# Patient Record
Sex: Female | Born: 1975 | Race: Black or African American | Hispanic: No | Marital: Single | Smoking: Current every day smoker
Health system: Southern US, Community
[De-identification: ages and names within clinical notes are randomized; demographics above are authoritative.]

## PROBLEM LIST (undated history)

## (undated) ENCOUNTER — Ambulatory Visit (HOSPITAL_COMMUNITY): Payer: Commercial Managed Care - PPO

## (undated) DIAGNOSIS — M1990 Unspecified osteoarthritis, unspecified site: Secondary | ICD-10-CM

## (undated) DIAGNOSIS — G4730 Sleep apnea, unspecified: Secondary | ICD-10-CM

## (undated) DIAGNOSIS — M7542 Impingement syndrome of left shoulder: Secondary | ICD-10-CM

## (undated) DIAGNOSIS — F419 Anxiety disorder, unspecified: Secondary | ICD-10-CM

## (undated) DIAGNOSIS — M797 Fibromyalgia: Secondary | ICD-10-CM

## (undated) DIAGNOSIS — E039 Hypothyroidism, unspecified: Secondary | ICD-10-CM

## (undated) DIAGNOSIS — F3181 Bipolar II disorder: Secondary | ICD-10-CM

## (undated) DIAGNOSIS — D649 Anemia, unspecified: Secondary | ICD-10-CM

## (undated) DIAGNOSIS — K219 Gastro-esophageal reflux disease without esophagitis: Secondary | ICD-10-CM

---

## 1999-06-08 HISTORY — PX: ANTERIOR CRUCIATE LIGAMENT REPAIR: SHX115

## 2005-04-22 ENCOUNTER — Emergency Department (HOSPITAL_COMMUNITY): Admission: EM | Admit: 2005-04-22 | Discharge: 2005-04-22 | Payer: Self-pay | Admitting: Emergency Medicine

## 2007-06-08 HISTORY — PX: TOTAL KNEE ARTHROPLASTY: SHX125

## 2012-03-07 DIAGNOSIS — M7542 Impingement syndrome of left shoulder: Secondary | ICD-10-CM

## 2012-03-07 HISTORY — DX: Impingement syndrome of left shoulder: M75.42

## 2012-03-21 ENCOUNTER — Encounter (HOSPITAL_BASED_OUTPATIENT_CLINIC_OR_DEPARTMENT_OTHER): Payer: Self-pay | Admitting: *Deleted

## 2012-03-21 NOTE — Pre-Procedure Instructions (Signed)
Discussed pt's sx. of sleep apnea with Dr. Ivin Booty; pt. OK to come for surgery

## 2012-03-24 ENCOUNTER — Encounter (HOSPITAL_BASED_OUTPATIENT_CLINIC_OR_DEPARTMENT_OTHER): Admission: RE | Payer: Self-pay | Source: Ambulatory Visit

## 2012-03-24 ENCOUNTER — Ambulatory Visit (HOSPITAL_BASED_OUTPATIENT_CLINIC_OR_DEPARTMENT_OTHER): Admission: RE | Admit: 2012-03-24 | Payer: Medicaid Other | Source: Ambulatory Visit | Admitting: Orthopedic Surgery

## 2012-03-24 HISTORY — DX: Hypothyroidism, unspecified: E03.9

## 2012-03-24 HISTORY — DX: Sleep apnea, unspecified: G47.30

## 2012-03-24 HISTORY — DX: Fibromyalgia: M79.7

## 2012-03-24 HISTORY — DX: Anxiety disorder, unspecified: F41.9

## 2012-03-24 HISTORY — DX: Gastro-esophageal reflux disease without esophagitis: K21.9

## 2012-03-24 HISTORY — DX: Bipolar II disorder: F31.81

## 2012-03-24 HISTORY — DX: Impingement syndrome of left shoulder: M75.42

## 2012-03-24 HISTORY — DX: Unspecified osteoarthritis, unspecified site: M19.90

## 2012-03-24 HISTORY — DX: Anemia, unspecified: D64.9

## 2012-03-24 SURGERY — ARTHROSCOPY, SHOULDER
Anesthesia: General | Site: Shoulder | Laterality: Left

## 2012-10-27 ENCOUNTER — Encounter (HOSPITAL_BASED_OUTPATIENT_CLINIC_OR_DEPARTMENT_OTHER): Payer: Self-pay | Admitting: *Deleted

## 2012-10-27 ENCOUNTER — Emergency Department (HOSPITAL_BASED_OUTPATIENT_CLINIC_OR_DEPARTMENT_OTHER)
Admission: EM | Admit: 2012-10-27 | Discharge: 2012-10-27 | Disposition: A | Discharge status: Home / Self Care | Payer: Medicaid Other | Attending: Emergency Medicine | Admitting: Emergency Medicine

## 2012-10-27 DIAGNOSIS — K219 Gastro-esophageal reflux disease without esophagitis: Secondary | ICD-10-CM | POA: Insufficient documentation

## 2012-10-27 DIAGNOSIS — Z862 Personal history of diseases of the blood and blood-forming organs and certain disorders involving the immune mechanism: Secondary | ICD-10-CM | POA: Insufficient documentation

## 2012-10-27 DIAGNOSIS — Z880 Allergy status to penicillin: Secondary | ICD-10-CM | POA: Insufficient documentation

## 2012-10-27 DIAGNOSIS — Z79899 Other long term (current) drug therapy: Secondary | ICD-10-CM | POA: Insufficient documentation

## 2012-10-27 DIAGNOSIS — J029 Acute pharyngitis, unspecified: Secondary | ICD-10-CM | POA: Insufficient documentation

## 2012-10-27 DIAGNOSIS — M19049 Primary osteoarthritis, unspecified hand: Secondary | ICD-10-CM | POA: Insufficient documentation

## 2012-10-27 DIAGNOSIS — Z8659 Personal history of other mental and behavioral disorders: Secondary | ICD-10-CM | POA: Insufficient documentation

## 2012-10-27 DIAGNOSIS — G4730 Sleep apnea, unspecified: Secondary | ICD-10-CM | POA: Insufficient documentation

## 2012-10-27 DIAGNOSIS — F17200 Nicotine dependence, unspecified, uncomplicated: Secondary | ICD-10-CM | POA: Insufficient documentation

## 2012-10-27 DIAGNOSIS — R079 Chest pain, unspecified: Secondary | ICD-10-CM | POA: Insufficient documentation

## 2012-10-27 DIAGNOSIS — Z8739 Personal history of other diseases of the musculoskeletal system and connective tissue: Secondary | ICD-10-CM | POA: Insufficient documentation

## 2012-10-27 DIAGNOSIS — M791 Myalgia, unspecified site: Secondary | ICD-10-CM

## 2012-10-27 DIAGNOSIS — E039 Hypothyroidism, unspecified: Secondary | ICD-10-CM | POA: Insufficient documentation

## 2012-10-27 DIAGNOSIS — R52 Pain, unspecified: Secondary | ICD-10-CM | POA: Insufficient documentation

## 2012-10-27 DIAGNOSIS — M1710 Unilateral primary osteoarthritis, unspecified knee: Secondary | ICD-10-CM | POA: Insufficient documentation

## 2012-10-27 NOTE — ED Provider Notes (Signed)
Medical screening examination/treatment/procedure(s) were performed by non-physician practitioner and as supervising physician I was immediately available for consultation/collaboration.   Gavin Pound. Kein Carlberg, MD 10/27/12 1438

## 2012-10-27 NOTE — ED Notes (Signed)
Patient states she developed right chest pain and tightness approximately 3 days ago.  States the pain has moved from her right chest to bilateral arms and legs.  Today pain has progressed into her face.

## 2012-10-27 NOTE — ED Provider Notes (Addendum)
History     CSN: [id number]  Arrival date & time 10/27/12  1231   First MD Initiated Contact with Patient 10/27/12 1343      Chief Complaint  Patient presents with  . Pain    generalized body pain    (Consider location/radiation/quality/duration/timing/severity/associated sxs/prior treatment) Patient is a 37 y.o. female presenting with musculoskeletal pain.  Muscle Pain This is a recurrent problem. The current episode started in the past 7 days. The problem occurs 2 to 4 times per day. The problem has been gradually worsening. Associated symptoms include chest pain, myalgias and a sore throat. She has tried nothing for the symptoms. The treatment provided no relief.    Past Medical History  Diagnosis Date  . Anemia     no current meds.  . Arthritis     knee, hands  . Fibromyalgia   . GERD (gastroesophageal reflux disease)   . Hypothyroidism   . Anxiety   . Bipolar 2 disorder     no current meds.  . Sleep apnea     states daytime fatigue, occ. wakes up gasping/choking; has not had a sleep study  . Impingement syndrome, shoulder, left 03/2012    Past Surgical History  Procedure Laterality Date  . Total knee arthroplasty  2009    left  . Cesarean section  1994  . Anterior cruciate ligament repair  2001    left    No family history on file.  History  Substance Use Topics  . Smoking status: Current Every Day Smoker -- 1.00 packs/day for 15 years    Types: Cigarettes  . Smokeless tobacco: Never Used  . Alcohol Use: Yes     Comment: socially    OB History   Grav Para Term Preterm Abortions TAB SAB Ect Mult Living                  Review of Systems  HENT: Positive for sore throat.   Cardiovascular: Positive for chest pain.  Musculoskeletal: Positive for myalgias.  All other systems reviewed and are negative.    Allergies  Penicillins  Home Medications   Current Outpatient Rx  Name  Route  Sig  Dispense  Refill  . HYDROmorphone HCl (EXALGO) 12 MG  TB24   Oral   Take 16 mg by mouth daily.         Marland Kitchen levothyroxine (SYNTHROID, LEVOTHROID) 175 MCG tablet   Oral   Take 175 mcg by mouth daily.         Marland Kitchen omeprazole (PRILOSEC) 20 MG capsule   Oral   Take 20 mg by mouth daily.         Marland Kitchen oxyCODONE-acetaminophen (PERCOCET) 10-325 MG per tablet   Oral   Take 1 tablet by mouth every 4 (four) hours as needed.           BP 134/85  Temp(Src) 98.2 F (36.8 C) (Oral)  Resp 20  SpO2 100%  LMP 10/09/2012  Physical Exam  Nursing note and vitals reviewed. Constitutional: She is oriented to person, place, and time. She appears well-developed and well-nourished.  HENT:  Head: Normocephalic.  Pharyngeal erythema  Eyes: Pupils are equal, round, and reactive to light.  Neck: Normal range of motion.  Cardiovascular: Normal rate, regular rhythm and normal heart sounds.   Pulmonary/Chest: Effort normal and breath sounds normal. No respiratory distress.  Abdominal: Soft. Bowel sounds are normal.  Musculoskeletal: She exhibits tenderness. She exhibits no edema.  Chest wall, upper extremities  Lymphadenopathy:    She has no cervical adenopathy.  Neurological: She is alert and oriented to person, place, and time.  Skin: Skin is warm and dry.  Psychiatric: She has a normal mood and affect. Her behavior is normal. Judgment and thought content normal.    ED Course  Procedures (including critical care time)  Date: 10/27/2012  Rate: 84  Rhythm: normal sinus rhythm  QRS Axis: normal  Intervals: normal  ST/T Wave abnormalities: normal  Conduction Disutrbances:none  Narrative Interpretation: NSR  Old EKG Reviewed: none available   Labs Reviewed  RAPID STREP SCREEN   No results found.   No diagnosis found.  No ischemia on ECG.  Stroke screen negative.  Pain similar to typical fibromyalgia. Discussed case with Dr. Oletta Lamas.  Doubt ACS, stroke, PE.  Labs reviewed, results shared with patient.  Return precautions discussed.  Patient has  an appointment with her PCP on June 5.  MDM          Jimmye Norman, NP 10/27/12 1433  Jimmye Norman, NP 11/07/12 1429

## 2012-10-29 LAB — CULTURE, GROUP A STREP

## 2012-11-07 NOTE — ED Provider Notes (Signed)
Medical screening examination/treatment/procedure(s) were performed by non-physician practitioner and as supervising physician I was immediately available for consultation/collaboration.   Gavin Pound. Anyah Swallow, MD 11/07/12 1506

## 2014-02-07 ENCOUNTER — Encounter (HOSPITAL_BASED_OUTPATIENT_CLINIC_OR_DEPARTMENT_OTHER): Payer: Self-pay | Admitting: Emergency Medicine

## 2014-02-07 ENCOUNTER — Emergency Department (HOSPITAL_BASED_OUTPATIENT_CLINIC_OR_DEPARTMENT_OTHER)
Admission: EM | Admit: 2014-02-07 | Discharge: 2014-02-07 | Disposition: A | Discharge status: Home / Self Care | Payer: Medicaid Other | Attending: Emergency Medicine | Admitting: Emergency Medicine

## 2014-02-07 DIAGNOSIS — F17200 Nicotine dependence, unspecified, uncomplicated: Secondary | ICD-10-CM | POA: Diagnosis not present

## 2014-02-07 DIAGNOSIS — Y929 Unspecified place or not applicable: Secondary | ICD-10-CM | POA: Insufficient documentation

## 2014-02-07 DIAGNOSIS — K219 Gastro-esophageal reflux disease without esophagitis: Secondary | ICD-10-CM | POA: Insufficient documentation

## 2014-02-07 DIAGNOSIS — Z79899 Other long term (current) drug therapy: Secondary | ICD-10-CM | POA: Diagnosis not present

## 2014-02-07 DIAGNOSIS — W268XXA Contact with other sharp object(s), not elsewhere classified, initial encounter: Secondary | ICD-10-CM | POA: Diagnosis not present

## 2014-02-07 DIAGNOSIS — IMO0002 Reserved for concepts with insufficient information to code with codable children: Secondary | ICD-10-CM

## 2014-02-07 DIAGNOSIS — E039 Hypothyroidism, unspecified: Secondary | ICD-10-CM | POA: Diagnosis not present

## 2014-02-07 DIAGNOSIS — Y9389 Activity, other specified: Secondary | ICD-10-CM | POA: Insufficient documentation

## 2014-02-07 DIAGNOSIS — Z8739 Personal history of other diseases of the musculoskeletal system and connective tissue: Secondary | ICD-10-CM | POA: Diagnosis not present

## 2014-02-07 DIAGNOSIS — Z880 Allergy status to penicillin: Secondary | ICD-10-CM | POA: Insufficient documentation

## 2014-02-07 DIAGNOSIS — S61209A Unspecified open wound of unspecified finger without damage to nail, initial encounter: Secondary | ICD-10-CM | POA: Insufficient documentation

## 2014-02-07 NOTE — Discharge Instructions (Signed)

## 2014-02-07 NOTE — ED Provider Notes (Signed)
CSN: [id number]     Arrival date & time 02/07/14  0439 History   First MD Initiated Contact with Patient 02/07/14 0505     Chief Complaint  Patient presents with  . Finger Injury     (Consider location/radiation/quality/duration/timing/severity/associated sxs/prior Treatment) HPI Comments: Accidentally cut left index finger with a knife making a sandwich earlier today. Mild pain at the site of the cut. Bleeding has stopped with pressure. Tetanus is up-to-date.   Past Medical History  Diagnosis Date  . Anemia     no current meds.  . Arthritis     knee, hands  . Fibromyalgia   . GERD (gastroesophageal reflux disease)   . Hypothyroidism   . Anxiety   . Bipolar 2 disorder     no current meds.  . Sleep apnea     states daytime fatigue, occ. wakes up gasping/choking; has not had a sleep study  . Impingement syndrome, shoulder, left 03/2012   Past Surgical History  Procedure Laterality Date  . Total knee arthroplasty  2009    left  . Cesarean section  1994  . Anterior cruciate ligament repair  2001    left   History reviewed. No pertinent family history. History  Substance Use Topics  . Smoking status: Current Every Day Smoker -- 1.00 packs/day for 15 years    Types: Cigarettes  . Smokeless tobacco: Never Used  . Alcohol Use: Yes     Comment: socially   OB History   Grav Para Term Preterm Abortions TAB SAB Ect Mult Living                 Review of Systems  Skin: Positive for wound.      Allergies  Penicillins  Home Medications   Prior to Admission medications   Medication Sig Start Date End Date Taking? Authorizing Provider  HYDROmorphone HCl (EXALGO) 12 MG TB24 Take 16 mg by mouth daily.    Historical Provider, MD  levothyroxine (SYNTHROID, LEVOTHROID) 175 MCG tablet Take 175 mcg by mouth daily.    Historical Provider, MD  omeprazole (PRILOSEC) 20 MG capsule Take 20 mg by mouth daily.    Historical Provider, MD  oxyCODONE-acetaminophen (PERCOCET) 10-325 MG  per tablet Take 1 tablet by mouth every 4 (four) hours as needed.    Historical Provider, MD   BP 130/87  Pulse 88  Temp(Src) 98.1 F (36.7 C) (Oral)  Resp 20  Ht  (1.651 m)  Wt 215 lb (97.523 kg)  BMI 35.78 kg/m2  SpO2 100%  LMP 01/24/2014 Physical Exam  Musculoskeletal:       Left hand: She exhibits laceration. She exhibits normal range of motion, normal two-point discrimination, normal capillary refill and no deformity. Normal sensation noted. Normal strength noted.       Hands: Neurological: She has normal strength. No cranial nerve deficit or sensory deficit.  Skin:       ED Course  Procedures (including critical care time)  LACERATION REPAIR Performed by: Gilda Crease. Authorized by: Gilda Crease Consent: Verbal consent obtained. Risks and benefits: risks, benefits and alternatives were discussed Consent given by: patient Patient identity confirmed: provided demographic data Prepped and Draped in normal sterile fashion Wound explored  Laceration Location: finger  Laceration Length: 1cm No Foreign Bodies seen or palpated Anesthesia: none Irrigation method: syringe Amount of cleaning: standard Skin closure: Dermabond Patient tolerance: Patient tolerated the procedure well with no immediate complications.     Labs Review Labs Reviewed -  No data to display  Imaging Review No results found.   EKG Interpretation None      MDM   Final diagnoses:  Laceration    Assessment laceration left index finger. Distal sensation and motor function intact. Wound is superficial, closed with Dermabond.    Gilda Crease, MD 02/07/14 [phone]

## 2014-02-07 NOTE — ED Notes (Signed)
Pt lacerated left pointer finger with knife while making sandwich at home tonight

## 2019-07-09 ENCOUNTER — Encounter (HOSPITAL_BASED_OUTPATIENT_CLINIC_OR_DEPARTMENT_OTHER): Payer: Self-pay

## 2019-07-09 ENCOUNTER — Emergency Department (HOSPITAL_BASED_OUTPATIENT_CLINIC_OR_DEPARTMENT_OTHER)
Admission: EM | Admit: 2019-07-09 | Discharge: 2019-07-09 | Disposition: A | Discharge status: Home / Self Care | Payer: Medicaid Other | Attending: Emergency Medicine | Admitting: Emergency Medicine

## 2019-07-09 ENCOUNTER — Emergency Department (HOSPITAL_BASED_OUTPATIENT_CLINIC_OR_DEPARTMENT_OTHER): Payer: Medicaid Other

## 2019-07-09 ENCOUNTER — Other Ambulatory Visit: Payer: Self-pay

## 2019-07-09 DIAGNOSIS — M797 Fibromyalgia: Secondary | ICD-10-CM | POA: Insufficient documentation

## 2019-07-09 DIAGNOSIS — W231XXD Caught, crushed, jammed, or pinched between stationary objects, subsequent encounter: Secondary | ICD-10-CM | POA: Insufficient documentation

## 2019-07-09 DIAGNOSIS — Z87891 Personal history of nicotine dependence: Secondary | ICD-10-CM | POA: Insufficient documentation

## 2019-07-09 DIAGNOSIS — Z79899 Other long term (current) drug therapy: Secondary | ICD-10-CM | POA: Insufficient documentation

## 2019-07-09 DIAGNOSIS — E039 Hypothyroidism, unspecified: Secondary | ICD-10-CM | POA: Insufficient documentation

## 2019-07-09 DIAGNOSIS — S9002XD Contusion of left ankle, subsequent encounter: Secondary | ICD-10-CM | POA: Insufficient documentation

## 2019-07-09 DIAGNOSIS — S9002XA Contusion of left ankle, initial encounter: Secondary | ICD-10-CM

## 2019-07-09 DIAGNOSIS — Z880 Allergy status to penicillin: Secondary | ICD-10-CM | POA: Insufficient documentation

## 2019-07-09 NOTE — ED Notes (Signed)
Pt had ankle x-ray done at [facility] [place] on 1/21 (date of injury);   Imaging waiting on EDMD or EDPA to evaluate the need for additional imaging; EDRN made aware  Please contact imaging to perform xray if warranted

## 2019-07-09 NOTE — Discharge Instructions (Signed)
Your xray is normal. Diclofenac sodium gel (brand is voltaren). Apply to your area of pain as directed. Continue elevating and icing you ankle.  Schedule an appointment with the sports medicine specialist, Dr. Karris Deangelo Likes, for further evaluation.

## 2019-07-09 NOTE — ED Provider Notes (Signed)
[facility] EMERGENCY DEPARTMENT Provider Note   CSN: [id number] Arrival date & time: 07/09/19  1307     History Chief Complaint  Patient presents with  . Ankle Injury    Crystal Leon is a 44 y.o. femalew PMHx fibromyalgia, presenting to the emergency department with complaint of persistent left ankle pain.  Patient closed her left ankle in a car door on 06/27/2019.  She was evaluated at [facility] regional ED on 06/28/2019 - x-ray and diagnosed with contusion.  She states she has been treating symptomatically including ibuprofen, ice, Epsom salt soaks though pain is persisting.  She states the bruising and swelling has significantly improved.  However, she is significant pain when any shoe touches the inside of her ankle.  Pain is not necessarily caused by weightbearing.  She has not followed up outpatient, she does not have a PCP nor health insurance.  The history is provided by the patient and medical records.       Past Medical History:  Diagnosis Date  . Anemia    no current meds.  . Anxiety   . Arthritis    knee, hands  . Bipolar 2 disorder (HCC)    no current meds.  . Fibromyalgia   . GERD (gastroesophageal reflux disease)   . Hypothyroidism   . Impingement syndrome, shoulder, left 03/2012  . Sleep apnea    states daytime fatigue, occ. wakes up gasping/choking; has not had a sleep study    There are no problems to display for this patient.   Past Surgical History:  Procedure Laterality Date  . ANTERIOR CRUCIATE LIGAMENT REPAIR  2001   left  . CESAREAN SECTION  1994  . TOTAL KNEE ARTHROPLASTY  2009   left     OB History   No obstetric history on file.     No family history on file.  Social History   Tobacco Use  . Smoking status: Former Smoker    Packs/day: 1.00    Years: 15.00    Pack years: 15.00    Types: Cigarettes  . Smokeless tobacco: Never Used  Substance Use Topics  . Alcohol use: Yes    Comment: occ  . Drug use: No     Home Medications Prior to Admission medications   Medication Sig Start Date End Date Taking? Authorizing Provider  HYDROmorphone HCl (EXALGO) 12 MG TB24 Take 16 mg by mouth daily.    [provider]  levothyroxine (SYNTHROID, LEVOTHROID) 175 MCG tablet Take 175 mcg by mouth daily.    [provider]  omeprazole (PRILOSEC) 20 MG capsule Take 20 mg by mouth daily.    [provider]  oxyCODONE-acetaminophen (PERCOCET) 10-325 MG per tablet Take 1 tablet by mouth every 4 (four) hours as needed.    [provider]    Allergies    Penicillins  Review of Systems   Review of Systems  All other systems reviewed and are negative.   Physical Exam Updated Vital Signs BP 126/89 (BP Location: Right Arm)   Pulse 86   Temp 98.7 F (37.1 C) (Oral)   Resp 18   Ht 5\' 5"  (1.651 m)   Wt 95.3 kg   LMP 07/05/2019   SpO2 100%   BMI 34.95 kg/m   Physical Exam Vitals and nursing note reviewed.  Constitutional:      General: She is not in acute distress.    Appearance: She is well-developed.  HENT:     Head: Normocephalic and atraumatic.  Eyes:     Conjunctiva/sclera: Conjunctivae normal.  Cardiovascular:     Rate and Rhythm: Normal rate.  Pulmonary:     Effort: Pulmonary effort is normal.  Abdominal:     Palpations: Abdomen is soft.  Musculoskeletal:     Comments: Left ankle without swelling, bruising, or deformity. TTP to medial aspect of the ankle.  Patient is able to range the ankle.  No wounds.  No skin changes.  Normal pulses and sensation.  Skin:    General: Skin is warm.  Neurological:     Mental Status: She is alert.  Psychiatric:        Behavior: Behavior normal.     ED Results / Procedures / Treatments   Labs (all labs ordered are listed, but only abnormal results are displayed) Labs Reviewed - No data to display  EKG None  Radiology DG Ankle Complete Left  Result Date: 07/09/2019 CLINICAL DATA:  Ankle injury with pain.  EXAM: LEFT ANKLE COMPLETE - 3+ VIEW COMPARISON:  None. FINDINGS: No fracture. No subluxation or dislocation. Possible mild soft tissue swelling laterally. IMPRESSION: Negative. Electronically Signed   By: Misty Stanley M.D.   On: 07/09/2019 14:50    Procedures Procedures (including critical care time)  Medications Ordered in ED Medications - No data to display  ED Course  I have reviewed the triage vital signs and the nursing notes.  Pertinent labs & imaging results that were available during my care of the patient were reviewed by me and considered in my medical decision making (see chart for details).    MDM Rules/Calculators/A&P                     Patient presenting with persistent pain to the medial ankle after closing her ankle in a car door on January 20.  She had negative films done outside ED, repeat imaging today to assess for missed occult fracture is also negative.  Swelling and bruising significantly improved, comparing with patient's picture that she provided on her telephone.  Provide referral to sports medicine clinic due to ongoing symptoms.  Recommend she try topical Voltaren gel for pain relief as well.  She agrees with plan at this time.  Safer discharge.  Discussed results, findings, treatment and follow up. Patient advised of return precautions. Patient verbalized understanding and agreed with plan.  Final Clinical Impression(s) / ED Diagnoses Final diagnoses:  Contusion of left ankle, initial encounter    Rx / DC Orders ED Discharge Orders    None       Becci Batty, [place] N, PA-C 07/09/19 1549    Sherwood Gambler, MD 07/11/19 [phone]

## 2019-07-09 NOTE — ED Triage Notes (Addendum)
Pt states she closed left ankle in a car door 1/20-seen at [facility] ED 1/21-NAD-to triage in w/c

## 2019-07-17 ENCOUNTER — Encounter: Payer: Self-pay | Admitting: Family Medicine

## 2019-07-17 ENCOUNTER — Ambulatory Visit: Payer: Self-pay

## 2019-07-17 ENCOUNTER — Ambulatory Visit (INDEPENDENT_AMBULATORY_CARE_PROVIDER_SITE_OTHER): Payer: Self-pay | Admitting: Family Medicine

## 2019-07-17 ENCOUNTER — Other Ambulatory Visit: Payer: Self-pay

## 2019-07-17 VITALS — BP 142/97 | HR 72 | Ht 65.0 in | Wt 210.0 lb

## 2019-07-17 DIAGNOSIS — M898X6 Other specified disorders of bone, lower leg: Secondary | ICD-10-CM

## 2019-07-17 DIAGNOSIS — M76822 Posterior tibial tendinitis, left leg: Secondary | ICD-10-CM

## 2019-07-17 DIAGNOSIS — S9002XA Contusion of left ankle, initial encounter: Secondary | ICD-10-CM | POA: Insufficient documentation

## 2019-07-17 DIAGNOSIS — Q742 Other congenital malformations of lower limb(s), including pelvic girdle: Secondary | ICD-10-CM | POA: Insufficient documentation

## 2019-07-17 NOTE — Assessment & Plan Note (Signed)
Overlying hematoma of the posterior tibialis and medial malleolus. -Counseled on compression and heat. -Counseled on supportive care

## 2019-07-17 NOTE — Assessment & Plan Note (Signed)
Unclear if there is a partial tear near the insertion of the posterior tibialis.  She does have some pain in this area.  Has worsening pain when she is plantarflexed.  There is no hyperemia to suggest an acute change on ultrasound. -Cam walker. -Counseled on home exercise therapy and supportive care. -May need to consider advanced imaging if pain is ongoing upon follow-up.

## 2019-07-17 NOTE — Patient Instructions (Signed)
Nice to meet you Please try to find a CAM walker  Please try heat on the hematoma  Please try the exercises   Please try voltaren over the counter Please send me a message in MyChart with any questions or updates.  Please see me back in 3-4 weeks.   --Dr. Jordan Likes

## 2019-07-17 NOTE — Progress Notes (Signed)
Crystal Leon - 44 y.o. female MRN [id number]  Date of birth: July 11, 1975  SUBJECTIVE:  Including CC & ROS.  Chief Complaint  Patient presents with  . Ankle Pain    left ankle    Crystal Leon is a 44 y.o. female that is presenting with left lower leg and foot pain.  She had initial injury on 1/20 while she was shutting her foot in the door.  She has had pain in the medial aspect of the lower leg since that time.  She also has some pain on the medial aspect of the midfoot.  Has pain with standing.  Has had an ecchymosis change in this area as well..  Independent review of the left ankle x-ray from 2/1 shows no acute bony abnormality.   Review of Systems See HPI   HISTORY: Past Medical, Surgical, Social, and Family History Reviewed & Updated per EMR.   Pertinent Historical Findings include:  Past Medical History:  Diagnosis Date  . Anemia    no current meds.  . Anxiety   . Arthritis    knee, hands  . Bipolar 2 disorder ([place])    no current meds.  . Fibromyalgia   . GERD (gastroesophageal reflux disease)   . Hypothyroidism   . Impingement syndrome, shoulder, left 03/2012  . Sleep apnea    states daytime fatigue, occ. wakes up gasping/choking; has not had a sleep study    Past Surgical History:  Procedure Laterality Date  . ANTERIOR CRUCIATE LIGAMENT REPAIR  2001   left  . CESAREAN SECTION  1994  . TOTAL KNEE ARTHROPLASTY  2009   left    Allergies  Allergen Reactions  . Penicillins Other (See Comments)    YEAST INFECTION    No family history on file.   Social History   Socioeconomic History  . Marital status: Single    Spouse name: Not on file  . Number of children: Not on file  . Years of education: Not on file  . Highest education level: Not on file  Occupational History  . Not on file  Tobacco Use  . Smoking status: Former Smoker    Packs/day: 1.00    Years: 15.00    Pack years: 15.00    Types: Cigarettes  . Smokeless tobacco: Never Used    Substance and Sexual Activity  . Alcohol use: Yes    Comment: occ  . Drug use: No  . Sexual activity: Not on file  Other Topics Concern  . Not on file  Social History Narrative  . Not on file   Social Determinants of Health   Financial Resource Strain:   . Difficulty of Paying Living Expenses: Not on file  Food Insecurity:   . Worried About Charity fundraiser in the Last Year: Not on file  . Ran Out of Food in the Last Year: Not on file  Transportation Needs:   . Lack of Transportation (Medical): Not on file  . Lack of Transportation (Non-Medical): Not on file  Physical Activity:   . Days of Exercise per Week: Not on file  . Minutes of Exercise per Session: Not on file  Stress:   . Feeling of Stress : Not on file  Social Connections:   . Frequency of Communication with Friends and Family: Not on file  . Frequency of Social Gatherings with Friends and Family: Not on file  . Attends Religious Services: Not on file  . Active Member of Clubs or Organizations: Not on  file  . Attends Banker Meetings: Not on file  . Marital Status: Not on file  Intimate Partner Violence:   . Fear of Current or Ex-Partner: Not on file  . Emotionally Abused: Not on file  . Physically Abused: Not on file  . Sexually Abused: Not on file     PHYSICAL EXAM:  VS: BP (!) 142/97   Pulse 72   Ht 5\' 5"  (1.651 m)   Wt 210 lb (95.3 kg)   LMP 07/05/2019   BMI 34.95 kg/m  Physical Exam Gen: NAD, alert, cooperative with exam, well-appearing ENT: normal lips, normal nasal mucosa,  Eye: normal EOM, normal conjunctiva and lids Skin: no rashes, no areas of induration  Neuro: normal tone, normal sensation to touch Psych:  normal insight, alert and oriented MSK:  Left lower leg/ankle/foot: Hyperechoic area where the trauma occurred in the medial aspect of the lower leg. Tenderness to palpation over the medial malleolus. Some tenderness to palpation over the navicular bone. Normal  strength resistance. Normal range of motion. Neurovascularly intact  Limited ultrasound: Left ankle/foot:  There is no overlying hematoma superficial to the posterior tibialis. There is soft tissues edema around this hematoma as well as distally. There is a hypoechoic region that seems to be the deep portion of the posterior tibialis on long views.  Unclear if this is a partial tear or a chronic change.  There is no hyperemia in this area. Achilles is intact and normal. No changes observed on the bone  Summary: Hematoma is present.  Unclear if partial tear of posterior tibialis.  Ultrasound and interpretation by 07/07/2019, MD   ASSESSMENT & PLAN:   Hematoma of left ankle Overlying hematoma of the posterior tibialis and medial malleolus. -Counseled on compression and heat. -Counseled on supportive care  Posterior tibial tendinitis, left Unclear if there is a partial tear near the insertion of the posterior tibialis.  She does have some pain in this area.  Has worsening pain when she is plantarflexed.  There is no hyperemia to suggest an acute change on ultrasound. -Cam walker. -Counseled on home exercise therapy and supportive care. -May need to consider advanced imaging if pain is ongoing upon follow-up.

## 2019-08-07 ENCOUNTER — Encounter: Payer: Self-pay | Admitting: Family Medicine

## 2019-08-07 ENCOUNTER — Other Ambulatory Visit: Payer: Self-pay

## 2019-08-07 ENCOUNTER — Ambulatory Visit (INDEPENDENT_AMBULATORY_CARE_PROVIDER_SITE_OTHER): Payer: Self-pay | Admitting: Family Medicine

## 2019-08-07 VITALS — BP 126/87 | HR 98 | Ht 65.0 in | Wt 210.0 lb

## 2019-08-07 DIAGNOSIS — S86112D Strain of other muscle(s) and tendon(s) of posterior muscle group at lower leg level, left leg, subsequent encounter: Secondary | ICD-10-CM

## 2019-08-07 DIAGNOSIS — S9002XA Contusion of left ankle, initial encounter: Secondary | ICD-10-CM

## 2019-08-07 NOTE — Assessment & Plan Note (Signed)
Pain is ongoing over the posterior tibialis and concern for rupture or partial rupture. -Continue cam walker. -Counseled on supportive care. -MRI to evaluate for possible rupture of the posterior tibialis

## 2019-08-07 NOTE — Progress Notes (Signed)
Crystal Leon - 44 y.o. female MRN [id number]  Date of birth: 12-13-1975  SUBJECTIVE:  Including CC & ROS.  Chief Complaint  Patient presents with  . Follow-up    follow up for left ankle    Crystal Leon is a 44 y.o. female that is following up for her left foot and ankle pain.  She has been in a cam walker since she was seen previously.  She does have some improvement of the ankle pain but the pain over the posterior tibialis is still apparent.  The pain can be severe at times.  Unable to walk without pain.  Unable to walk without the boot.   Review of Systems See HPI   HISTORY: Past Medical, Surgical, Social, and Family History Reviewed & Updated per EMR.   Pertinent Historical Findings include:  Past Medical History:  Diagnosis Date  . Anemia    no current meds.  . Anxiety   . Arthritis    knee, hands  . Bipolar 2 disorder (HCC)    no current meds.  . Fibromyalgia   . GERD (gastroesophageal reflux disease)   . Hypothyroidism   . Impingement syndrome, shoulder, left 03/2012  . Sleep apnea    states daytime fatigue, occ. wakes up gasping/choking; has not had a sleep study    Past Surgical History:  Procedure Laterality Date  . ANTERIOR CRUCIATE LIGAMENT REPAIR  2001   left  . CESAREAN SECTION  1994  . TOTAL KNEE ARTHROPLASTY  2009   left    No family history on file.  Social History   Socioeconomic History  . Marital status: Single    Spouse name: Not on file  . Number of children: Not on file  . Years of education: Not on file  . Highest education level: Not on file  Occupational History  . Not on file  Tobacco Use  . Smoking status: Former Smoker    Packs/day: 1.00    Years: 15.00    Pack years: 15.00    Types: Cigarettes  . Smokeless tobacco: Never Used  Substance and Sexual Activity  . Alcohol use: Yes    Comment: occ  . Drug use: No  . Sexual activity: Not on file  Other Topics Concern  . Not on file  Social History Narrative  . Not  on file   Social Determinants of Health   Financial Resource Strain:   . Difficulty of Paying Living Expenses: Not on file  Food Insecurity:   . Worried About Programme researcher, broadcasting/film/video in the Last Year: Not on file  . Ran Out of Food in the Last Year: Not on file  Transportation Needs:   . Lack of Transportation (Medical): Not on file  . Lack of Transportation (Non-Medical): Not on file  Physical Activity:   . Days of Exercise per Week: Not on file  . Minutes of Exercise per Session: Not on file  Stress:   . Feeling of Stress : Not on file  Social Connections:   . Frequency of Communication with Friends and Family: Not on file  . Frequency of Social Gatherings with Friends and Family: Not on file  . Attends Religious Services: Not on file  . Active Member of Clubs or Organizations: Not on file  . Attends Banker Meetings: Not on file  . Marital Status: Not on file  Intimate Partner Violence:   . Fear of Current or Ex-Partner: Not on file  . Emotionally Abused: Not on  file  . Physically Abused: Not on file  . Sexually Abused: Not on file     PHYSICAL EXAM:  VS: BP 126/87   Pulse 98   Ht 5\' 5"  (1.651 m)   Wt 210 lb (95.3 kg)   BMI 34.95 kg/m  Physical Exam Gen: NAD, alert, cooperative with exam, well-appearing MSK:  Left foot and ankle: No obvious swelling. Tenderness to palpation at the navicular and insertional posterior tibialis. Pain with resistance to inversion. Able to raise up on tiptoes but with severe pain. Neurovascularly intact     ASSESSMENT & PLAN:   Rupture of posterior tibialis tendon Pain is ongoing over the posterior tibialis and concern for rupture or partial rupture. -Continue cam walker. -Counseled on supportive care. -MRI to evaluate for possible rupture of the posterior tibialis  Hematoma of left ankle Has had improvement around the ankle with a cam walker. - counseled on compression.

## 2019-08-07 NOTE — Assessment & Plan Note (Signed)
Has had improvement around the ankle with a cam walker. - counseled on compression.

## 2019-08-07 NOTE — Patient Instructions (Signed)
Good to see you Please continue the boot  Please use ice  Please call [facility] in a couple days to schedule the MRI  Please send me a message in MyChart with any questions or updates.  We will set up a virtual visit once the MRI is resulted.    --Dr. Jordan Likes

## 2019-08-18 ENCOUNTER — Ambulatory Visit
Admission: RE | Admit: 2019-08-18 | Discharge: 2019-08-18 | Disposition: A | Discharge status: Home / Self Care | Payer: Medicaid Other | Source: Ambulatory Visit | Attending: Family Medicine | Admitting: Family Medicine

## 2019-08-18 DIAGNOSIS — S86112D Strain of other muscle(s) and tendon(s) of posterior muscle group at lower leg level, left leg, subsequent encounter: Secondary | ICD-10-CM

## 2019-08-20 ENCOUNTER — Other Ambulatory Visit: Payer: Self-pay

## 2019-08-20 ENCOUNTER — Telehealth: Payer: Self-pay | Admitting: Family Medicine

## 2019-08-20 ENCOUNTER — Telehealth (INDEPENDENT_AMBULATORY_CARE_PROVIDER_SITE_OTHER): Payer: Medicaid Other | Admitting: Family Medicine

## 2019-08-20 DIAGNOSIS — Q742 Other congenital malformations of lower limb(s), including pelvic girdle: Secondary | ICD-10-CM

## 2019-08-20 NOTE — Assessment & Plan Note (Signed)
Changes on ultrasound and pain may be likely related to the degenerative changes in the accessory navicular bone. -Counseled on home exercise therapy and supportive care. -Could consider custom orthotics or physical therapy or injection to this area.

## 2019-08-20 NOTE — Telephone Encounter (Signed)
Patient asking for a note excusing her from work until April 1st. She works at [employer] and states she is constantly on her feet and they will not allow her to wear any other shoes other than sneakers

## 2019-08-20 NOTE — Progress Notes (Signed)
Virtual Visit via Video Note  I connected with Crystal Leon on 08/20/19 at  1:30 PM EDT by a video enabled telemedicine application and verified that I am speaking with the correct person using two identifiers.   I discussed the limitations of evaluation and management by telemedicine and the availability of in person appointments. The patient expressed understanding and agreed to proceed.  History of Present Illness:  Crystal Leon is a 44 yo following up for her left foot pain.  There is concerned that she had a posterior tibialis tendon rupture.  MRI was completed and reassuring.  There is no significant structural changes observed of the foot.  There is some mild degenerative changes.   Observations/Objective:  Gen: NAD, alert, cooperative with exam, well-appearing  Assessment and Plan:  Accessory navicular bone of left foot: Changes on ultrasound and pain may be likely related to the degenerative changes in the accessory navicular bone. -Counseled on home exercise therapy and supportive care. -Could consider custom orthotics or physical therapy or injection to this area.  Follow Up Instructions:    I discussed the assessment and treatment plan with the patient. The patient was provided an opportunity to ask questions and all were answered. The patient agreed with the plan and demonstrated an understanding of the instructions.   The patient was advised to call back or seek an in-person evaluation if the symptoms worsen or if the condition fails to improve as anticipated.   Clare Gandy, MD

## 2019-08-21 NOTE — Telephone Encounter (Signed)
Informed patient of note being ready. She will pick the work note up from the office this afternoon.

## 2019-08-21 NOTE — Telephone Encounter (Signed)
Provided work note.   Myra Rude, MD [facility] 08/21/2019, 9:59 AM

## 2020-11-03 IMAGING — MR MR FOOT*L* W/O CM
5 series · 37 of 40 positions shown · non-contrast
Comparison: Plain films of the left ankle 06/28/2019 and
07/09/2019.

CLINICAL DATA: Left ankle pain since the patient shut her ankle in
a car door 06/26/2019.

EXAM:
MRI OF THE LEFT FOOT WITHOUT CONTRAST
TECHNIQUE: Multiplanar, multisequence MR imaging of the ankle was performed. No
intravenous contrast was administered.

[Series 4: T2 fat-sat · axial · 3.0mm · 0.50mm/px · z∈[-61,+67]mm · 9 of 34 slices shown (1 of 2)]
[im 1/34]
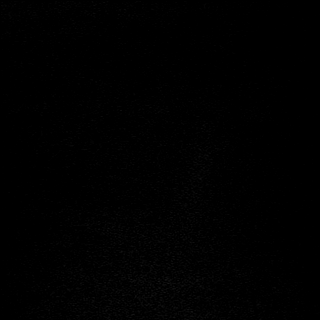
[im 5/34]
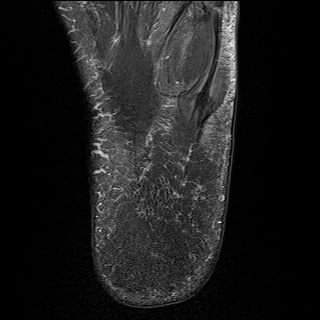
[im 9/34]
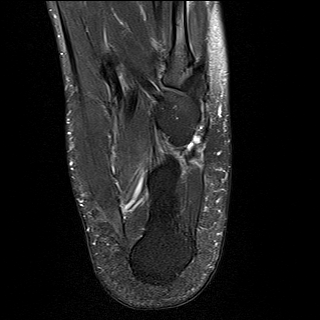
[im 13/34]
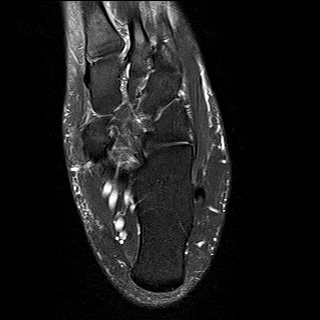
[im 17/34]
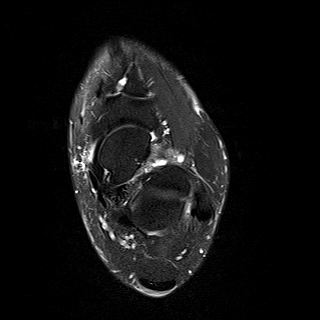
[im 21/34]
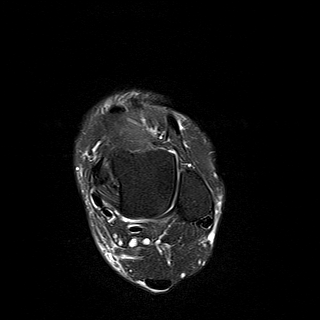
[im 25/34]
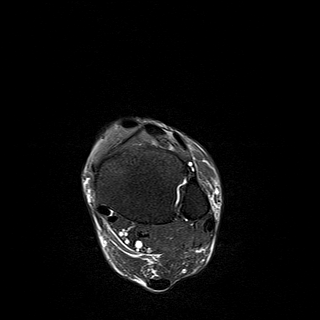
[im 29/34]
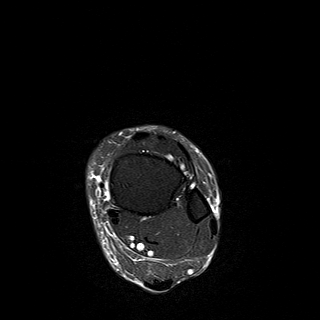
[im 34/34]
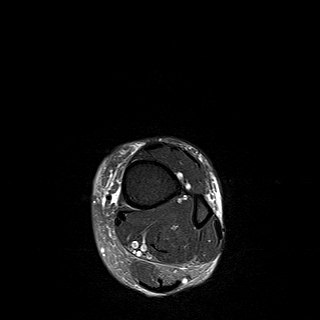

[Series 5: PD fat-sat · axial · 3.0mm · 0.42mm/px · z∈[-61,+67]mm · 9 of 34 slices shown]
[im 1/34]
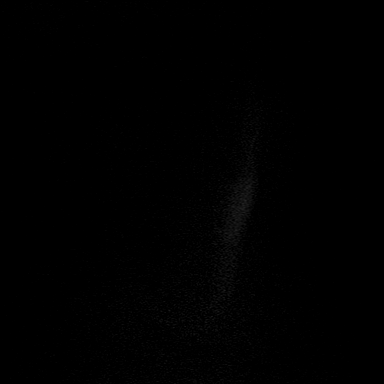
[im 5/34]
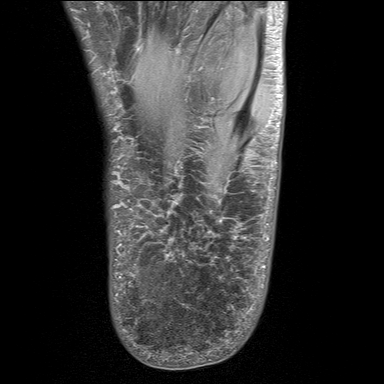
[im 9/34]
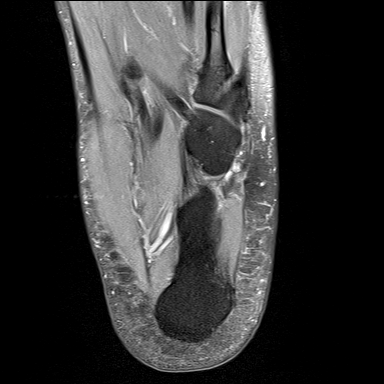
[im 13/34]
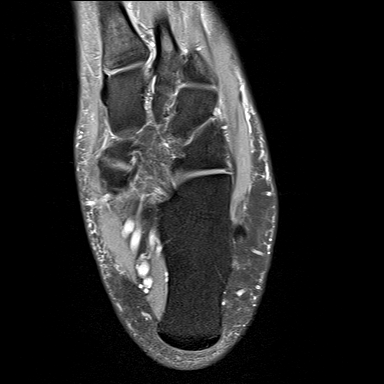
[im 17/34]
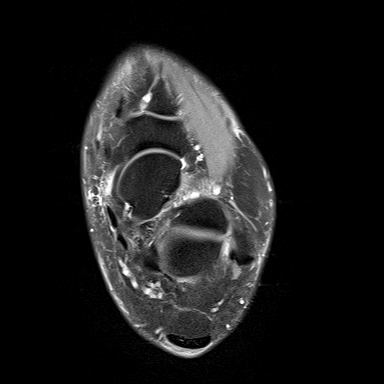
[im 21/34]
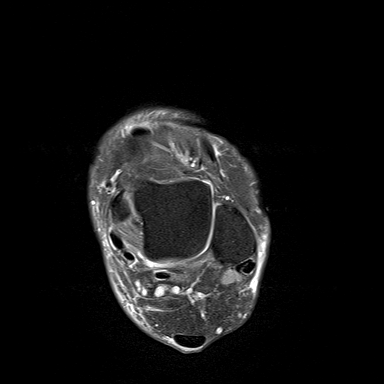
[im 25/34]
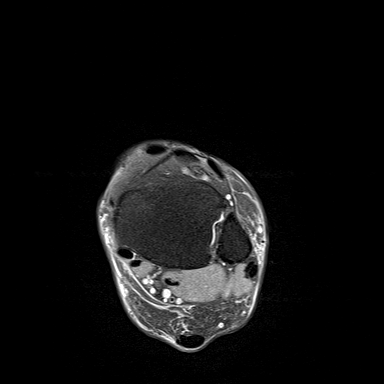
[im 29/34]
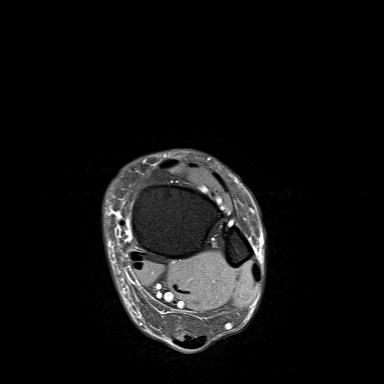
[im 34/34]
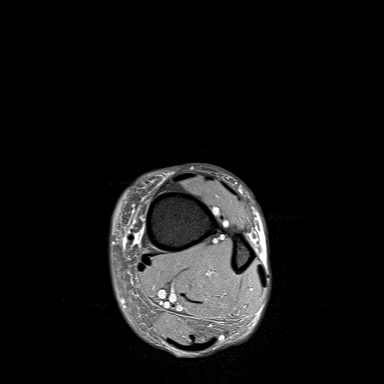

[Series 6: T1 · sagittal · 4.0mm · 0.56mm/px · 6 of 22 slices shown]
[im 1/22]
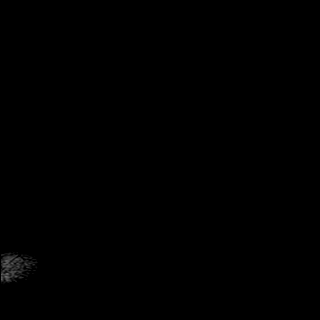
[im 5/22]
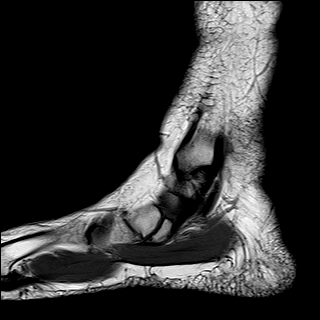
[im 9/22]
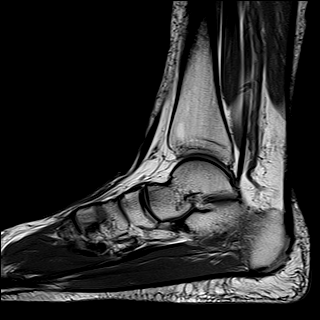
[im 13/22]
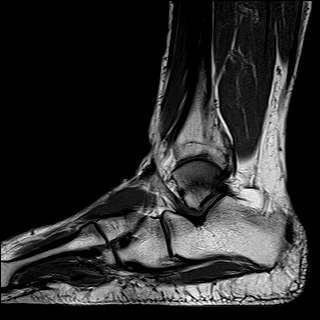
[im 17/22]
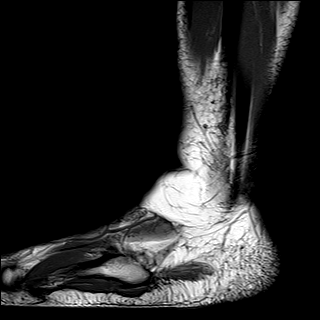
[im 22/22]
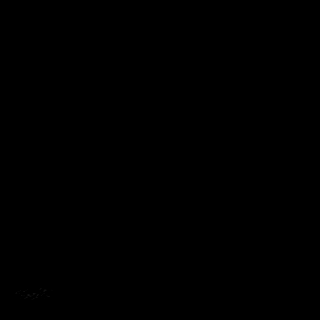

[Series 7: STIR · sagittal · 4.0mm · 0.35mm/px · 5 of 22 slices shown]
[im 1/22]
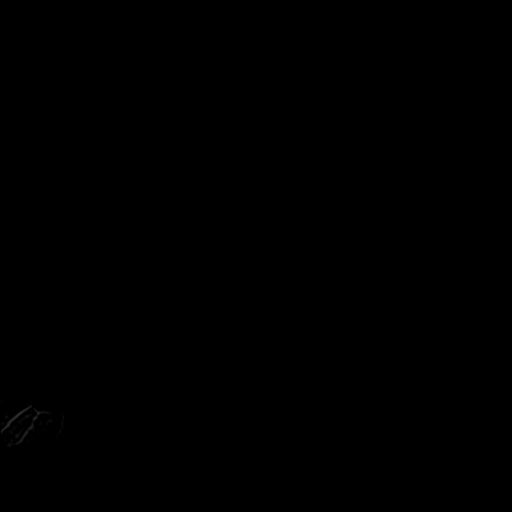
[im 5/22]
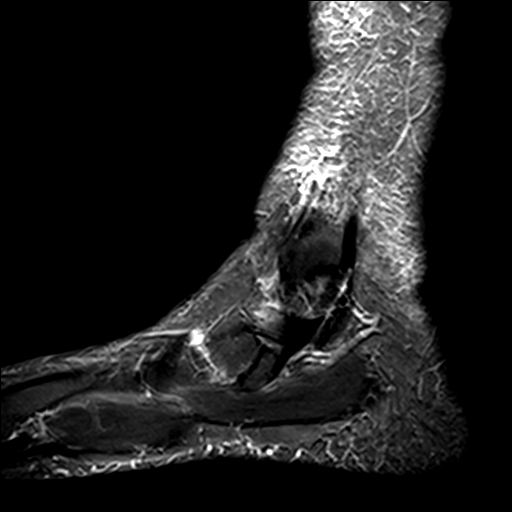
[im 9/22]
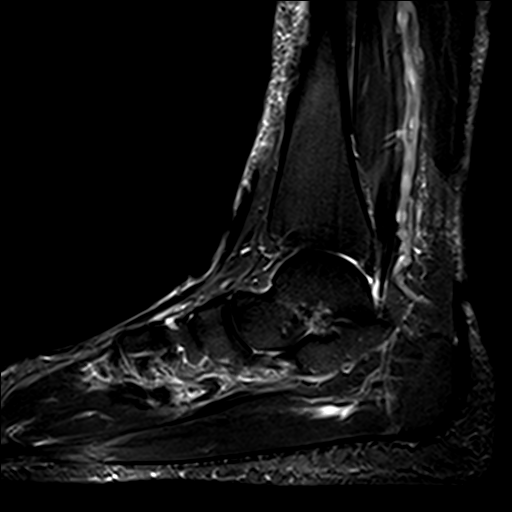
[im 13/22]
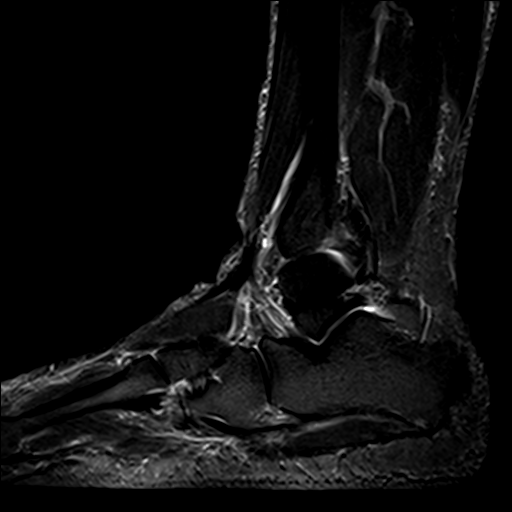
[im 17/22]
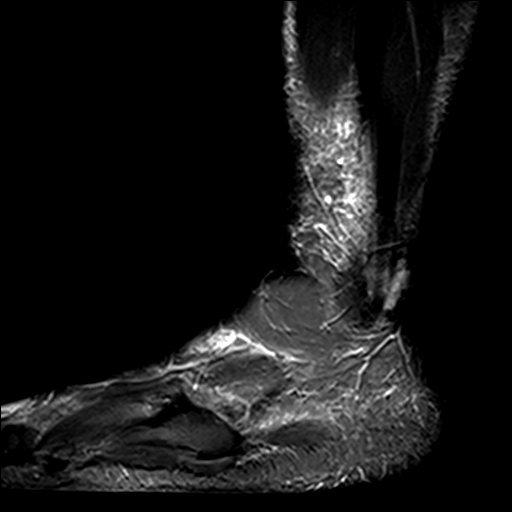

[Series 8: T2 fat-sat · coronal · 3.0mm · 0.50mm/px · 8 of 35 slices shown (2 of 2)]
[im 1/35]
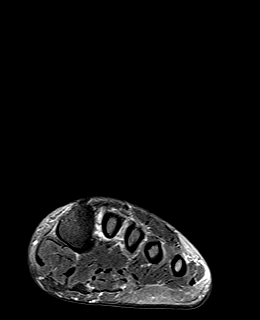
[im 4/35]
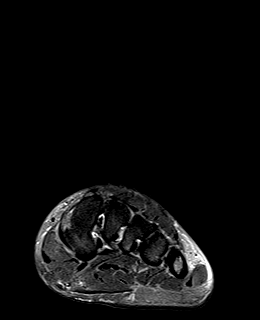
[im 12/35]
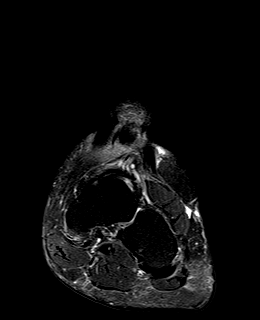
[im 16/35]
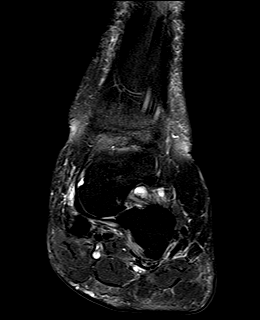
[im 19/35]
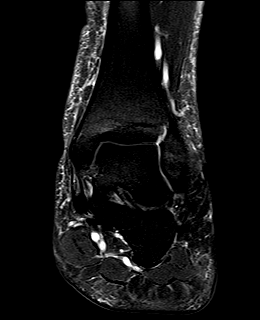
[im 23/35]
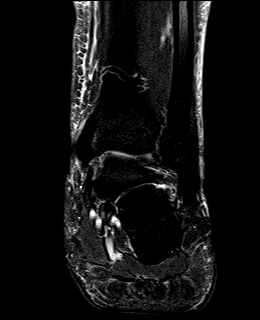
[im 31/35]
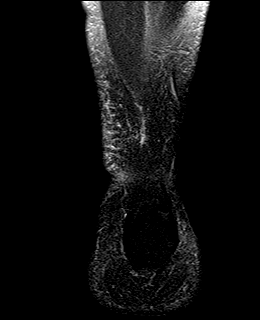
[im 35/35]
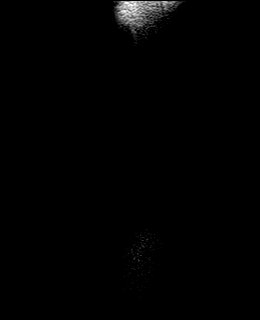

[37 of 40 positions shown; findings below may reference images not displayed]

FINDINGS: TENDONS

Peroneal: Intact.

Posteromedial: Intact.

Anterior: Intact.

Achilles: Intact.

Plantar Fascia: Intact with normal signal.

LIGAMENTS

Lateral: Intact.

Medial: Intact.

CARTILAGE

Ankle Joint: Normal. No osteochondral lesion of the talar dome or
joint effusion.

Subtalar Joints/Sinus Tarsi: Normal.

Bones: No fracture, contusion, stress change or worrisome lesion.
The patient has a type 2 navicular with minimal osteophytosis about
the synchondrosis.

Other: None.
IMPRESSION: Negative for evidence of trauma. No acute bony abnormality. All
tendons and ligaments are intact and normal in appearance.

Type 2 navicular with mild osteophytosis about the synchondrosis
compatible with degenerative change.

## 2021-05-12 ENCOUNTER — Emergency Department (HOSPITAL_BASED_OUTPATIENT_CLINIC_OR_DEPARTMENT_OTHER)
Admission: EM | Admit: 2021-05-12 | Discharge: 2021-05-12 | Disposition: A | Discharge status: Home / Self Care | Payer: Managed Care, Other (non HMO) | Attending: Emergency Medicine | Admitting: Emergency Medicine

## 2021-05-12 ENCOUNTER — Other Ambulatory Visit: Payer: Self-pay

## 2021-05-12 ENCOUNTER — Encounter (HOSPITAL_BASED_OUTPATIENT_CLINIC_OR_DEPARTMENT_OTHER): Payer: Self-pay

## 2021-05-12 DIAGNOSIS — F17210 Nicotine dependence, cigarettes, uncomplicated: Secondary | ICD-10-CM | POA: Diagnosis not present

## 2021-05-12 DIAGNOSIS — E039 Hypothyroidism, unspecified: Secondary | ICD-10-CM | POA: Insufficient documentation

## 2021-05-12 DIAGNOSIS — R102 Pelvic and perineal pain: Secondary | ICD-10-CM | POA: Diagnosis present

## 2021-05-12 DIAGNOSIS — N760 Acute vaginitis: Secondary | ICD-10-CM | POA: Insufficient documentation

## 2021-05-12 DIAGNOSIS — B9689 Other specified bacterial agents as the cause of diseases classified elsewhere: Secondary | ICD-10-CM

## 2021-05-12 LAB — URINALYSIS, MICROSCOPIC (REFLEX)

## 2021-05-12 LAB — PREGNANCY, URINE: Preg Test, Ur: NEGATIVE

## 2021-05-12 LAB — URINALYSIS, ROUTINE W REFLEX MICROSCOPIC
Bilirubin Urine: NEGATIVE
Glucose, UA: NEGATIVE mg/dL
Hgb urine dipstick: NEGATIVE
Ketones, ur: 15 mg/dL — AB
Nitrite: NEGATIVE
Protein, ur: NEGATIVE mg/dL
Specific Gravity, Urine: >1.03 — ABNORMAL HIGH (ref 1.005–1.030)
pH: 5.5 (ref 5.0–8.0)

## 2021-05-12 LAB — WET PREP, GENITAL
Sperm: NONE SEEN
Trich, Wet Prep: NONE SEEN
WBC, Wet Prep HPF POC: <10 (ref <10)
Yeast Wet Prep HPF POC: NONE SEEN

## 2021-05-12 MED ORDER — METRONIDAZOLE 500 MG PO TABS
500.0000 mg | ORAL_TABLET | Freq: Once | ORAL | Status: AC
  Administered 2021-05-12: 500 mg via ORAL
  Filled 2021-05-12: qty 1

## 2021-05-12 MED ORDER — METRONIDAZOLE 500 MG PO TABS: 500.0000 mg | ORAL_TABLET | Freq: Two times a day (BID) | ORAL | 0 refills | Status: DC

## 2021-05-12 NOTE — ED Provider Notes (Signed)
[facility] EMERGENCY DEPARTMENT Provider Note   CSN: [id number] Arrival date & time: 05/12/21  1744     History Chief Complaint  Patient presents with   Pelvic Pain    Crystal Leon is a 45 y.o. female.  HPI  45 year old female presents emergency department with lower pelvic discomfort and abnormal vaginal discharge.  She states that for over the past week she has been having yellow/greenish discharge with a foul odor.  She is currently sexually active.  Denies any significant vaginal discomfort.  No vaginal bleeding.  The pelvic pain developed yesterday, gradual onset, is not unilateral.  No associated fever/chills or systemic symptoms including nausea.  No history of STD in the past.  Past Medical History:  Diagnosis Date   Anemia    no current meds.   Anxiety    Arthritis    knee, hands   Bipolar 2 disorder (HCC)    no current meds.   Fibromyalgia    GERD (gastroesophageal reflux disease)    Hypothyroidism    Impingement syndrome, shoulder, left 03/2012   Sleep apnea    states daytime fatigue, occ. wakes up gasping/choking; has not had a sleep study    Patient Active Problem List   Diagnosis Date Noted   Accessory navicular bone of left foot 07/17/2019   Hematoma of left ankle 07/17/2019    Past Surgical History:  Procedure Laterality Date   ANTERIOR CRUCIATE LIGAMENT REPAIR  2001   left   CESAREAN SECTION  1994   TOTAL KNEE ARTHROPLASTY  2009   left     OB History   No obstetric history on file.     No family history on file.  Social History   Tobacco Use   Smoking status: Every Day    Packs/day: 1.00    Years: 15.00    Pack years: 15.00    Types: Cigarettes   Smokeless tobacco: Never  Vaping Use   Vaping Use: Never used  Substance Use Topics   Alcohol use: Yes    Comment: occ   Drug use: No    Home Medications Prior to Admission medications   Medication Sig Start Date End Date Taking? Authorizing Provider  metroNIDAZOLE  (FLAGYL) 500 MG tablet Take 1 tablet (500 mg total) by mouth 2 (two) times daily. 05/12/21  Yes Ivette Castronova, Clabe Seal, DO  HYDROmorphone HCl (EXALGO) 12 MG TB24 Take 16 mg by mouth daily.    [provider]  levothyroxine (SYNTHROID, LEVOTHROID) 175 MCG tablet Take 175 mcg by mouth daily.    [provider]  omeprazole (PRILOSEC) 20 MG capsule Take 20 mg by mouth daily.    [provider]  oxyCODONE-acetaminophen (PERCOCET) 10-325 MG per tablet Take 1 tablet by mouth every 4 (four) hours as needed.    [provider]    Allergies    Penicillins  Review of Systems   Review of Systems  Constitutional:  Negative for chills, fatigue and fever.  HENT:  Negative for congestion.   Respiratory:  Negative for shortness of breath.   Cardiovascular:  Negative for chest pain.  Gastrointestinal:  Negative for abdominal pain, diarrhea and vomiting.  Genitourinary:  Positive for pelvic pain, vaginal bleeding and vaginal discharge. Negative for difficulty urinating, dysuria, flank pain and vaginal pain.  Musculoskeletal:  Negative for back pain.  Skin:  Negative for rash.  Neurological:  Negative for headaches.   Physical Exam Updated Vital Signs BP 99/64 (BP Location: Right Arm)   Pulse  71   Temp 98.4 F (36.9 C) (Oral)   Resp 16   Ht 5\' 5"  (1.651 m)   Wt 97.5 kg   LMP 05/05/2021   SpO2 98%   BMI 35.78 kg/m   Physical Exam Vitals and nursing note reviewed.  Constitutional:      Appearance: Normal appearance.  HENT:     Head: Normocephalic.     Mouth/Throat:     Mouth: Mucous membranes are moist.  Cardiovascular:     Rate and Rhythm: Normal rate.  Pulmonary:     Effort: Pulmonary effort is normal. No respiratory distress.  Abdominal:     Palpations: Abdomen is soft.     Tenderness: There is no abdominal tenderness.  Genitourinary:    General: Normal vulva.     Comments: By RN 05/07/2021. External exam normal, speculum exam shows thin yellowish  discharge with slight odor, no vaginal bleeding, no cervicitis/CMT or adnexal pain Skin:    General: Skin is warm.  Neurological:     Mental Status: She is alert and oriented to person, place, and time. Mental status is at baseline.  Psychiatric:        Mood and Affect: Mood normal.    ED Results / Procedures / Treatments   Labs (all labs ordered are listed, but only abnormal results are displayed) Labs Reviewed  WET PREP, GENITAL - Abnormal; Notable for the following components:      Result Value   Clue Cells Wet Prep HPF POC PRESENT (*)    All other components within normal limits  URINALYSIS, ROUTINE W REFLEX MICROSCOPIC  PREGNANCY, URINE  GC/CHLAMYDIA PROBE AMP ([place]) NOT AT [facility]    EKG None  Radiology No results found.  Procedures Procedures   Medications Ordered in ED Medications  metroNIDAZOLE (FLAGYL) tablet 500 mg (has no administration in time range)    ED Course  I have reviewed the triage vital signs and the nursing notes.  Pertinent labs & imaging results that were available during my care of the patient were reviewed by me and considered in my medical decision making (see chart for details).    MDM Rules/Calculators/A&P                           45 year old female presents emergency department with vaginal discharge and slight pelvic pain.  Vitals are stable on arrival.  She is afebrile, nontoxic-appearing.  Speculum exam is significant for thin yellow discharge with slight odor.  No bleeding.  No CMT or adnexal fullness/pain.  Low suspicion for TOA at this time.  Wet prep significant for clue cells.  We will send additional testing for GC chlamydia.  We will treat for bacterial vaginosis.  Educated the patient on worsening symptoms and unilateral pelvic pain and need for reevaluation in that situation.  Educated the patient on dangers of alcohol use with Flagyl.  Patient at this time appears safe and stable for discharge and will be treated as an  outpatient.  Discharge plan and strict return to ED precautions discussed, patient verbalizes understanding and agreement.  Final Clinical Impression(s) / ED Diagnoses Final diagnoses:  BV (bacterial vaginosis)    Rx / DC Orders ED Discharge Orders          Ordered    metroNIDAZOLE (FLAGYL) 500 MG tablet  2 times daily        05/12/21 1942  Rozelle Logan, DO 05/12/21 [id number]

## 2021-05-12 NOTE — Discharge Instructions (Addendum)
You have been seen and discharged from the emergency department.  Your work-up is significant for bacterial vaginosis.  Take antibiotic as directed.  Avoid drinking alcohol while on this antibiotic.  We are sending additional GC chlamydia testing, if this is abnormal you will be contacted.  Follow-up with your primary provider for reevaluation and further care. Take home medications as prescribed. If you have any worsening symptoms or further concerns for your health please return to an emergency department for further evaluation.

## 2021-05-12 NOTE — ED Triage Notes (Signed)
Pt c/o pelvic pain started yesterday-vaginal d/c x 2 weeks-NAD-steady gait

## 2021-05-12 NOTE — ED Notes (Signed)
Patient discharged to home.  All discharge instructions reviewed.  Patient verbalized understanding via teachback method.  VS WDL.  Respirations even and unlabored.  Ambulatory out of ED.   °

## 2021-05-14 LAB — GC/CHLAMYDIA PROBE AMP (~~LOC~~) NOT AT ARMC
Chlamydia: NEGATIVE
Comment: NEGATIVE
Comment: NORMAL
Neisseria Gonorrhea: NEGATIVE

## 2022-02-11 ENCOUNTER — Emergency Department (HOSPITAL_BASED_OUTPATIENT_CLINIC_OR_DEPARTMENT_OTHER): Payer: Self-pay

## 2022-02-11 ENCOUNTER — Encounter (HOSPITAL_BASED_OUTPATIENT_CLINIC_OR_DEPARTMENT_OTHER): Payer: Self-pay | Admitting: Emergency Medicine

## 2022-02-11 ENCOUNTER — Emergency Department (HOSPITAL_BASED_OUTPATIENT_CLINIC_OR_DEPARTMENT_OTHER)
Admission: EM | Admit: 2022-02-11 | Discharge: 2022-02-11 | Disposition: A | Discharge status: Home / Self Care | Payer: Self-pay | Attending: Emergency Medicine | Admitting: Emergency Medicine

## 2022-02-11 ENCOUNTER — Other Ambulatory Visit: Payer: Self-pay

## 2022-02-11 DIAGNOSIS — Z79899 Other long term (current) drug therapy: Secondary | ICD-10-CM | POA: Insufficient documentation

## 2022-02-11 DIAGNOSIS — M79605 Pain in left leg: Secondary | ICD-10-CM | POA: Insufficient documentation

## 2022-02-11 MED ORDER — ACETAMINOPHEN 500 MG PO TABS
1000.0000 mg | ORAL_TABLET | Freq: Once | ORAL | Status: AC
  Administered 2022-02-11: 1000 mg via ORAL
  Filled 2022-02-11: qty 2

## 2022-02-11 MED ORDER — KETOROLAC TROMETHAMINE 15 MG/ML IJ SOLN
15.0000 mg | Freq: Once | INTRAMUSCULAR | Status: AC
  Administered 2022-02-11: 15 mg via INTRAMUSCULAR
  Filled 2022-02-11: qty 1

## 2022-02-11 MED ORDER — DIAZEPAM 5 MG PO TABS
5.0000 mg | ORAL_TABLET | Freq: Once | ORAL | Status: AC
  Administered 2022-02-11: 5 mg via ORAL
  Filled 2022-02-11: qty 1

## 2022-02-11 MED ORDER — OXYCODONE HCL 5 MG PO TABS
5.0000 mg | ORAL_TABLET | Freq: Once | ORAL | Status: AC
  Administered 2022-02-11: 5 mg via ORAL
  Filled 2022-02-11: qty 1

## 2022-02-11 NOTE — Discharge Instructions (Signed)
Your ultrasound was negative for a blood clot in the leg.  Your x-ray did not show any problems with your knee replacement.  Please follow-up with your orthopedist who did the knee replacement.  I have given you information for one of the sports medicine doctors upstairs if you would like to see him.  I think more likely you have sciatica.  Your back pain is most likely due to a muscular strain.  There is been a lot of research on back pain, unfortunately the only thing that seems to really help is Tylenol and ibuprofen.  Relative rest is also important to not lift greater than 10 pounds bending or twisting at the waist.  Please follow-up with your family physician.  The other thing that really seems to benefit patients is physical therapy which your doctor may send you for.  Please return to the emergency department for new numbness or weakness to your arms or legs. Difficulty with urinating or urinating or pooping on yourself.  Also if you cannot feel toilet paper when you wipe or get a fever.   Take 4 over the counter ibuprofen tablets 3 times a day or 2 over-the-counter naproxen tablets twice a day for pain. Also take tylenol 1000mg (2 extra strength) four times a day.

## 2022-02-11 NOTE — ED Triage Notes (Signed)
Patient presents C/O L calf/L thigh pain X several days. Patient concerned she may have DVT. No swelling.  Hx of L knee replacement in 09' and reports she did have DVT after surgery.

## 2022-02-11 NOTE — ED Notes (Signed)
Pt is currently in [place]

## 2022-02-11 NOTE — ED Notes (Signed)
X-ray at bedside

## 2022-02-11 NOTE — ED Provider Notes (Signed)
[facility] EMERGENCY DEPARTMENT Provider Note   CSN: [id number] Arrival date & time: 02/11/22  1800     History  Chief Complaint  Patient presents with   Leg Pain    Crystal Leon is a 46 y.o. female.  46 yo F with a chief complaint of left leg pain.  This has been going on for a couple days.  It is gotten acutely worse.  She has been having trouble with her back but did not think it was going down the leg.  She denies recent trauma denies loss of bowel or bladder denies loss of peritoneal sensation denies numbness or weakness to the leg.  She has had a DVT in that leg before and was concerned that it reoccurred.  Pain is worse with bearing weight bending and twisting.   Leg Pain      Home Medications Prior to Admission medications   Medication Sig Start Date End Date Taking? Authorizing Provider  HYDROmorphone HCl (EXALGO) 12 MG TB24 Take 16 mg by mouth daily.    [provider]  levothyroxine (SYNTHROID, LEVOTHROID) 175 MCG tablet Take 175 mcg by mouth daily.    [provider]  metroNIDAZOLE (FLAGYL) 500 MG tablet Take 1 tablet (500 mg total) by mouth 2 (two) times daily. 05/12/21   Horton, Danford Bad M, DO  omeprazole (PRILOSEC) 20 MG capsule Take 20 mg by mouth daily.    [provider]  oxyCODONE-acetaminophen (PERCOCET) 10-325 MG per tablet Take 1 tablet by mouth every 4 (four) hours as needed.    [provider]      Allergies    Penicillins    Review of Systems   Review of Systems  Physical Exam Updated Vital Signs BP (!) 144/94 (BP Location: Right Arm)   Pulse 92   Temp 99.6 F (37.6 C) (Oral)   Resp 20   Ht 5\' 5"  (1.651 m)   Wt 93 kg   SpO2 100%   BMI 34.11 kg/m  Physical Exam Vitals and nursing note reviewed.  Constitutional:      General: She is not in acute distress.    Appearance: She is well-developed. She is not diaphoretic.  HENT:     Head: Normocephalic and atraumatic.  Eyes:     Pupils:  Pupils are equal, round, and reactive to light.  Cardiovascular:     Rate and Rhythm: Normal rate and regular rhythm.     Heart sounds: No murmur heard.    No friction rub. No gallop.  Pulmonary:     Effort: Pulmonary effort is normal.     Breath sounds: No wheezing or rales.  Abdominal:     General: There is no distension.     Palpations: Abdomen is soft.     Tenderness: There is no abdominal tenderness.  Musculoskeletal:        General: No tenderness.     Cervical back: Normal range of motion and neck supple.     Comments: Tenderness about the left sided paraspinal musculature along the lumbar spine.  Pain at the piriformis.  Pulse motor and sensation intact distally.  She has some pain and some fullness about the left posterior knee.  No appreciable edema to the leg.  Reflexes are 2+ and equal.  No clonus.  Skin:    General: Skin is warm and dry.  Neurological:     Mental Status: She is alert and oriented to person, place, and time.  Psychiatric:  Behavior: Behavior normal.     ED Results / Procedures / Treatments   Labs (all labs ordered are listed, but only abnormal results are displayed) Labs Reviewed - No data to display  EKG None  Radiology US Venous Img Lower Unilateral Left  Result Date: 02/11/2022 CLINICAL DATA:  Left lower extremity pain EXAM: Left LOWER EXTREMITY VENOUS DOPPLER ULTRASOUND TECHNIQUE: Gray-scale sonography with compression, as well as color and duplex ultrasound, were performed to evaluate the deep venous system(s) from the level of the common femoral vein through the popliteal and proximal calf veins. COMPARISON:  None Available. FINDINGS: VENOUS Normal compressibility of the common femoral, superficial femoral, and popliteal veins, as well as the visualized calf veins. Visualized portions of profunda femoral vein and great saphenous vein unremarkable. No filling defects to suggest DVT on grayscale or color Doppler imaging. Doppler waveforms show  normal direction of venous flow, normal respiratory plasticity and response to augmentation. Limited views of the contralateral common femoral vein are unremarkable. OTHER None. Limitations: none IMPRESSION: Negative. Electronically Signed   By: Jasmine Pang M.D.   On: 02/11/2022 20:08   DG Knee Complete 4 Views Left  Result Date: 02/11/2022 CLINICAL DATA:  Knee pain EXAM: LEFT KNEE - COMPLETE 4+ VIEW COMPARISON:  None Available. FINDINGS: Status post left knee replacement with intact hardware. No fracture or malalignment. No sizable knee effusion. IMPRESSION: Knee replacement without acute osseous abnormality Electronically Signed   By: Jasmine Pang M.D.   On: 02/11/2022 20:03    Procedures Procedures    Medications Ordered in ED Medications  acetaminophen (TYLENOL) tablet 1,000 mg (1,000 mg Oral Given 02/11/22 2007)  ketorolac (TORADOL) 15 MG/ML injection 15 mg (15 mg Intramuscular Given 02/11/22 2008)  oxyCODONE (Oxy IR/ROXICODONE) immediate release tablet 5 mg (5 mg Oral Given 02/11/22 2007)  diazepam (VALIUM) tablet 5 mg (5 mg Oral Given 02/11/22 2007)    ED Course/ Medical Decision Making/ A&P                           Medical Decision Making Amount and/or Complexity of Data Reviewed Radiology: ordered.  Risk OTC drugs. Prescription drug management.   46 yo F with a chief complaints of left leg pain.  This has been going on for couple days now.  Atraumatic.  She is concerned that she may have a DVT, DVT study is negative.  Much more likely to be sciatica with low back pain that radiates down the leg that the patient also has focal left knee pain.  Plain film independently interpreted by me without fracture.  We will treat as sciatica.  PCP follow-up.  8:15 PM:  I have discussed the diagnosis/risks/treatment options with the patient.  Evaluation and diagnostic testing in the emergency department does not suggest an emergent condition requiring admission or immediate intervention beyond  what has been performed at this time.  They will follow up with PCP, sports med. We also discussed returning to the ED immediately if new or worsening sx occur. We discussed the sx which are most concerning (e.g., sudden worsening pain, fever, inability to tolerate by mouth) that necessitate immediate return. Medications administered to the patient during their visit and any new prescriptions provided to the patient are listed below.  Medications given during this visit Medications  acetaminophen (TYLENOL) tablet 1,000 mg (1,000 mg Oral Given 02/11/22 2007)  ketorolac (TORADOL) 15 MG/ML injection 15 mg (15 mg Intramuscular Given 02/11/22 2008)  oxyCODONE (Oxy  IR/ROXICODONE) immediate release tablet 5 mg (5 mg Oral Given 02/11/22 2007)  diazepam (VALIUM) tablet 5 mg (5 mg Oral Given 02/11/22 2007)     The patient appears reasonably screen and/or stabilized for discharge and I doubt any other medical condition or other [facility] requiring further screening, evaluation, or treatment in the ED at this time prior to discharge.          Final Clinical Impression(s) / ED Diagnoses Final diagnoses:  Left leg pain    Rx / DC Orders ED Discharge Orders     None         Melene Plan, DO 02/11/22 2015

## 2022-07-16 ENCOUNTER — Ambulatory Visit (HOSPITAL_COMMUNITY)
Admission: EM | Admit: 2022-07-16 | Discharge: 2022-07-16 | Disposition: A | Discharge status: Home / Self Care | Payer: Commercial Managed Care - PPO | Attending: Emergency Medicine | Admitting: Emergency Medicine

## 2022-07-16 ENCOUNTER — Encounter (HOSPITAL_COMMUNITY): Payer: Self-pay

## 2022-07-16 DIAGNOSIS — B349 Viral infection, unspecified: Secondary | ICD-10-CM | POA: Diagnosis not present

## 2022-07-16 DIAGNOSIS — U071 COVID-19: Secondary | ICD-10-CM | POA: Insufficient documentation

## 2022-07-16 LAB — POC INFLUENZA A AND B ANTIGEN (URGENT CARE ONLY)
INFLUENZA A ANTIGEN, POC: NEGATIVE
INFLUENZA B ANTIGEN, POC: NEGATIVE

## 2022-07-16 MED ORDER — ACETAMINOPHEN 325 MG PO TABS
ORAL_TABLET | ORAL | Status: AC
  Filled 2022-07-16: qty 2

## 2022-07-16 MED ORDER — ACETAMINOPHEN 325 MG PO TABS
650.0000 mg | ORAL_TABLET | Freq: Once | ORAL | Status: AC
  Administered 2022-07-16: 650 mg via ORAL

## 2022-07-16 NOTE — ED Triage Notes (Signed)
Pt c/o cough, congestion, headaches, body aches, and chills. Taking aleve with some relief.

## 2022-07-16 NOTE — Discharge Instructions (Signed)
Flu test negative today. I will call you if your covid test returns positive.   I recommend to continue tylenol and ibuprofen for body aches and headache You can use mucinex or robitussin for congestion/cough Drink lots of fluids!  Hopefully symptoms will improve over the week. Please return with any worsening symptoms or concerns

## 2022-07-16 NOTE — ED Provider Notes (Signed)
[place]    CSN: [id number] Arrival date & time: 07/16/22  1919      History   Chief Complaint Chief Complaint  Patient presents with   Cough    HPI Crystal Leon is a 47 y.o. female.  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   Anxiety    Arthritis    knee, hands   Bipolar 2 disorder (HCC)    no current meds.   Fibromyalgia    GERD (gastroesophageal reflux disease)    Hypothyroidism    Impingement syndrome, shoulder, left 03/2012   Sleep apnea    states daytime fatigue, occ. 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      Home Medications    Prior to Admission medications   Medication Sig Start Date End Date Taking? Authorizing Provider  levothyroxine (SYNTHROID, LEVOTHROID) 175 MCG tablet Take 50 mcg by mouth daily.    [provider]    Family History History reviewed. No pertinent family history.  Social History Social History   Tobacco Use   Smoking status: Every Day    Packs/day: 1.00    Years: 15.00    Total pack years: 15.00    Types: Cigarettes   Smokeless tobacco: Never  Vaping Use   Vaping Use: Never used  Substance Use Topics   Alcohol use: Yes    Comment: occ   Drug use: No     Allergies   Penicillins   Review of Systems Review of Systems As per  HPI  Physical Exam Triage Vital Signs ED Triage Vitals  Enc Vitals Group     BP 07/16/22 1938 (!) 134/92     Pulse Rate 07/16/22 1938 (!) 109     Resp 07/16/22 1938 18     Temp 07/16/22 1938 98.1 F (36.7 C)     Temp Source 07/16/22 1938 Oral     SpO2 07/16/22 1938 98 %     Weight --      Height --      Head Circumference --      Peak Flow --      Pain Score 07/16/22 1939 6     Pain Loc --      Pain Edu? --      Excl. in [place]? --    No data found.  Updated Vital Signs BP (!) 134/92 (BP Location: Right Arm)   Pulse (!) 109   Temp 98.1 F (36.7 C) (Oral)   Resp 18   SpO2 98%    Physical Exam Vitals and nursing note reviewed.  Constitutional:      General: She is not in acute distress. HENT:     Right Ear: Tympanic membrane and ear  canal normal.     Left Ear: Tympanic membrane and ear canal normal.     Nose: No rhinorrhea.     Mouth/Throat:     Mouth: Mucous membranes are moist.     Pharynx: Oropharynx is clear. No posterior oropharyngeal erythema.  Eyes:     Conjunctiva/sclera: Conjunctivae normal.  Cardiovascular:     Rate and Rhythm: Normal rate and regular rhythm.     Pulses: Normal pulses.     Heart sounds: Normal heart sounds.  Pulmonary:     Effort: Pulmonary effort is normal.     Breath sounds: Normal breath sounds.  Musculoskeletal:     Cervical back: Normal range of motion.  Lymphadenopathy:     Cervical: No cervical adenopathy.  Skin:    General: Skin is warm and dry.  Neurological:     Mental Status: She is alert and oriented to person, place, and time.     UC Treatments / Results  Labs (all labs ordered are listed, but only abnormal results are displayed) Labs Reviewed  SARS CORONAVIRUS 2 (TAT 6-24 HRS)  POC INFLUENZA A AND B ANTIGEN (URGENT CARE ONLY)    EKG  Radiology No results found.  Procedures Procedures   Medications Ordered in UC Medications  acetaminophen (TYLENOL) tablet 650 mg (650 mg Oral Given 07/16/22 1958)     Initial Impression / Assessment and Plan / UC Course  I have reviewed the triage vital signs and the nursing notes.  Pertinent labs & imaging results that were available during my care of the patient were reviewed by me and considered in my medical decision making (see chart for details).  Tylenol dose given for aches  Rapid flu negative. Covid test pending. Candidate antivirals if positive. No recent GFR in system - consider renal dosing paxlovid. Discussed viral etiology, symptomatic care at home Work note provided Return precautions discussed. Patient agrees to plan  Final Clinical Impressions(s) / UC Diagnoses   Final diagnoses:  Viral illness     Discharge Instructions      Flu test negative today. I will call you if your covid test returns positive.   I recommend to continue tylenol and ibuprofen for body aches and headache You can use mucinex or robitussin for congestion/cough Drink lots of fluids!  Hopefully symptoms will improve over the week. Please return with any worsening symptoms or concerns      ED Prescriptions   None    PDMP not reviewed this encounter.   Naysa Puskas, Vernice Jefferson 07/16/22 2013

## 2022-07-17 ENCOUNTER — Telehealth (HOSPITAL_COMMUNITY): Payer: Self-pay | Admitting: Emergency Medicine

## 2022-07-17 LAB — SARS CORONAVIRUS 2 (TAT 6-24 HRS): SARS Coronavirus 2: POSITIVE — AB

## 2022-07-17 MED ORDER — NIRMATRELVIR/RITONAVIR (PAXLOVID) TABLET (RENAL DOSING): 2.0000 | ORAL_TABLET | Freq: Two times a day (BID) | ORAL | 0 refills | Status: AC

## 2022-07-17 NOTE — Telephone Encounter (Signed)
Positive covid, sent antivirals to pharmacy. No GFR on file, sent paxlovid renal dosing. All questions answered

## 2022-09-19 ENCOUNTER — Emergency Department (HOSPITAL_COMMUNITY)
Admission: EM | Admit: 2022-09-19 | Discharge: 2022-09-19 | Disposition: A | Discharge status: Home / Self Care | Payer: Commercial Managed Care - PPO | Attending: Emergency Medicine | Admitting: Emergency Medicine

## 2022-09-19 ENCOUNTER — Encounter (HOSPITAL_COMMUNITY): Payer: Self-pay | Admitting: Emergency Medicine

## 2022-09-19 DIAGNOSIS — K625 Hemorrhage of anus and rectum: Secondary | ICD-10-CM | POA: Diagnosis not present

## 2022-09-19 LAB — CBC WITH DIFFERENTIAL/PLATELET
Abs Immature Granulocytes: 0.03 10*3/uL (ref 0.00–0.07)
Basophils Absolute: 0 10*3/uL (ref 0.0–0.1)
Basophils Relative: 0 %
Eosinophils Absolute: 0.1 10*3/uL (ref 0.0–0.5)
Eosinophils Relative: 2 %
HCT: 39 % (ref 36.0–46.0)
Hemoglobin: 13.7 g/dL (ref 12.0–15.0)
Immature Granulocytes: 1 %
Lymphocytes Relative: 47 %
Lymphs Abs: 3 10*3/uL (ref 0.7–4.0)
MCH: 31.6 pg (ref 26.0–34.0)
MCHC: 35.1 g/dL (ref 30.0–36.0)
MCV: 89.9 fL (ref 80.0–100.0)
Monocytes Absolute: 0.4 10*3/uL (ref 0.1–1.0)
Monocytes Relative: 7 %
Neutro Abs: 2.6 10*3/uL (ref 1.7–7.7)
Neutrophils Relative %: 43 %
Platelets: 216 10*3/uL (ref 150–400)
RBC: 4.34 MIL/uL (ref 3.87–5.11)
RDW: 14.2 % (ref 11.5–15.5)
WBC: 6.2 10*3/uL (ref 4.0–10.5)
nRBC: 0 % (ref 0.0–0.2)

## 2022-09-19 LAB — COMPREHENSIVE METABOLIC PANEL
ALT: 22 U/L (ref 0–44)
AST: 20 U/L (ref 15–41)
Albumin: 3.5 g/dL (ref 3.5–5.0)
Alkaline Phosphatase: 66 U/L (ref 38–126)
Anion gap: 11 (ref 5–15)
BUN: 17 mg/dL (ref 6–20)
CO2: 23 mmol/L (ref 22–32)
Calcium: 9.2 mg/dL (ref 8.9–10.3)
Chloride: 104 mmol/L (ref 98–111)
Creatinine, Ser: 0.95 mg/dL (ref 0.44–1.00)
GFR, Estimated: >60 mL/min (ref >60)
Glucose, Bld: 117 mg/dL — ABNORMAL HIGH (ref 70–99)
Potassium: 3.7 mmol/L (ref 3.5–5.1)
Sodium: 138 mmol/L (ref 135–145)
Total Bilirubin: 0.5 mg/dL (ref 0.3–1.2)
Total Protein: 6.7 g/dL (ref 6.5–8.1)

## 2022-09-19 LAB — CBC
HCT: 38.4 % (ref 36.0–46.0)
Hemoglobin: 12.8 g/dL (ref 12.0–15.0)
MCH: 30 pg (ref 26.0–34.0)
MCHC: 33.3 g/dL (ref 30.0–36.0)
MCV: 90.1 fL (ref 80.0–100.0)
Platelets: 189 10*3/uL (ref 150–400)
RBC: 4.26 MIL/uL (ref 3.87–5.11)
RDW: 14.3 % (ref 11.5–15.5)
WBC: 5.8 10*3/uL (ref 4.0–10.5)
nRBC: 0 % (ref 0.0–0.2)

## 2022-09-19 NOTE — ED Provider Notes (Signed)
[facility]-EMERGENCY DEPT [facility] Emergency Department Provider Note MRN:  [id number]  Arrival date & time: 09/19/22     Chief Complaint   Rectal Bleeding   History of Present Illness   Crystal Leon is a 47 y.o. year-old female presents to the ED with chief complaint of rectal bleeding.  Had colonoscopy with polypectomy x 2 and internal hemorrhoidal banding 2 days ago.  Reports having large amounts of bleeding when she has BMs today.  Denies any abdominal pain, fever, or other symptoms.  She is not anticoagulated.  GI: Dr. Drue Flirt  History provided by patient.   Review of Systems  Pertinent positive and negative review of systems noted in HPI.    Physical Exam   Vitals:   09/19/22 0300 09/19/22 0423  BP: 121/81 106/74  Pulse: 83 62  Resp:  18  Temp:    SpO2: 96% 96%    CONSTITUTIONAL:  well-appearing, NAD NEURO:  Alert and oriented x 3, CN 3-12 grossly intact EYES:  eyes equal and reactive ENT/NECK:  Supple, no stridor  CARDIO:  normal rate, regular rhythm, appears well-perfused  PULM:  No respiratory distress, CTAB GI/GU:  non-distended, non-tender MSK/SPINE:  No gross deformities, no edema, moves all extremities  SKIN:  no rash, atraumatic   *Additional and/or pertinent findings included in MDM below  Diagnostic and Interventional Summary    EKG Interpretation  Date/Time:    Ventricular Rate:    PR Interval:    QRS Duration:   QT Interval:    QTC Calculation:   R Axis:     Text Interpretation:         Labs Reviewed  COMPREHENSIVE METABOLIC PANEL - Abnormal; Notable for the following components:      Result Value   Glucose, Bld 117 (*)    All other components within normal limits  CBC WITH DIFFERENTIAL/PLATELET  CBC    No orders to display    Medications - No data to display   Procedures  /  Critical Care Procedures  ED Course and Medical Decision Making  I have reviewed the triage vital signs, the nursing notes, and pertinent  available records from the EMR.  Social Determinants Affecting Complexity of Care: Patient has no clinically significant social determinants affecting this chief complaint..   ED Course:    Medical Decision Making Patient here with rectal bleeding after polypectomy and internal hemorrhoid banding 2 days ago.  Initial labs done in triage are reassuring.  Vitals are stable.  Patient does not have any pain or tenderness.  She is well-appearing.  Amount and/or Complexity of Data Reviewed Labs: ordered.     Consultants: I consulted with NP Claudette Laws, who takes call for [facility].  Recommends 4-6 hour observation, repeat HGB, and follow-up in GI office on Monday morning.  He will arrange for the patient to be seen and she can show up in the morning.  Will call back if patient's status changes.  Treatment and Plan: I considered admission due to patient's initial presentation, but after considering the examination and diagnostic results, patient will not require admission and can be discharged with outpatient follow-up.  Patient discussed with attending physician, Dr. Jacqulyn Bath, who agrees that since patient is stable and has close follow-up that she can be discharged home at this time.  We reviewed the patient's labs.  Final Clinical Impressions(s) / ED Diagnoses     ICD-10-CM   1. Rectal bleeding  K62.5       ED  Discharge Orders     None         Discharge Instructions Discussed with and Provided to Patient:     Discharge Instructions      I spoke Nurse Practitioner Claudette Laws, who was on-call for [facility] group.  He indicated that he would arrange for you to be seen by a gastroenterologist on Monday morning and that you should go to the clinic on Monday and they will make a spot for you.  If your symptoms change or worsen, please return.   Mild spotting is ok, large bleeding is an indication to return.  Shortness of breath, pain dizziness, fever are all reasons  to come back.       Roxy Horseman, PA-C 09/19/22 0440    Maia Plan, MD 09/19/22 [phone]

## 2022-09-19 NOTE — Discharge Instructions (Signed)
I spoke Nurse Practitioner Claudette Laws, who was on-call for [facility] group.  He indicated that he would arrange for you to be seen by a gastroenterologist on Monday morning and that you should go to the clinic on Monday and they will make a spot for you.  If your symptoms change or worsen, please return.   Mild spotting is ok, large bleeding is an indication to return.  Shortness of breath, pain dizziness, fever are all reasons to come back.

## 2022-09-19 NOTE — ED Triage Notes (Signed)
PT had colonoscopy Friday. Removed large internal hemorrhoid and polyps. She had small amount of bleeding on toilet paper yesterday. Several BM today of bright red blood she states filled toilet.

## 2022-09-20 ENCOUNTER — Encounter: Payer: Self-pay | Admitting: *Deleted

## 2022-10-21 ENCOUNTER — Ambulatory Visit (HOSPITAL_COMMUNITY)
Admission: EM | Admit: 2022-10-21 | Discharge: 2022-10-21 | Disposition: A | Discharge status: Home / Self Care | Payer: Commercial Managed Care - PPO | Attending: Urgent Care | Admitting: Urgent Care

## 2022-10-21 ENCOUNTER — Encounter (HOSPITAL_COMMUNITY): Payer: Self-pay | Admitting: Emergency Medicine

## 2022-10-21 DIAGNOSIS — B3731 Acute candidiasis of vulva and vagina: Secondary | ICD-10-CM | POA: Insufficient documentation

## 2022-10-21 LAB — POCT URINALYSIS DIP (MANUAL ENTRY)
Bilirubin, UA: NEGATIVE
Glucose, UA: NEGATIVE mg/dL
Ketones, POC UA: NEGATIVE mg/dL
Leukocytes, UA: NEGATIVE
Nitrite, UA: NEGATIVE
Protein Ur, POC: NEGATIVE mg/dL
Spec Grav, UA: 1.025 (ref 1.010–1.025)
Urobilinogen, UA: 0.2 E.U./dL
pH, UA: 7 (ref 5.0–8.0)

## 2022-10-21 LAB — POCT URINE PREGNANCY: Preg Test, Ur: NEGATIVE

## 2022-10-21 MED ORDER — FLUCONAZOLE 150 MG PO TABS: ORAL_TABLET | ORAL | 0 refills | Status: DC

## 2022-10-21 NOTE — ED Triage Notes (Signed)
Pt can "see the yeast" in vaginal area for about week. Tried OTC Monastat. Wanting STD testing.

## 2022-10-21 NOTE — ED Provider Notes (Signed)
[facility]    CSN: [id number] Arrival date & time: 10/21/22  1236      History   Chief Complaint Chief Complaint  Patient presents with   Vaginal Discharge    HPI Crystal Leon is a 47 y.o. female.   47yo female presents today with complaint of thick white clumpy discharge and intermittent vaginal itching for the past week. Endorses being prone to vaginal yeast infections. Denies hx of DM, but runs in her family. Denies pelvic pain, urinary frequency, dysuria, or hematuria. LNMP was 2-3 months ago, pt is in peri-menopause. Not currently on OCPs or contraception. Requesting std testing. States her partner is here as well getting tested to ensure they aren't passing anything back and forth.    Vaginal Discharge   Past Medical History:  Diagnosis Date   Anemia    no current meds.   Anxiety    Arthritis    knee, hands   Bipolar 2 disorder (HCC)    no current meds.   Fibromyalgia    GERD (gastroesophageal reflux disease)    Hypothyroidism    Impingement syndrome, shoulder, left 03/2012   Sleep apnea    states daytime fatigue, occ. 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      Home Medications    Prior to Admission medications   Medication Sig Start Date End Date Taking? Authorizing Provider  fluconazole (DIFLUCAN) 150 MG tablet One tab PO x 1 dose now. Repeat in 72 hours as needed 10/21/22  Yes Tammie Yanda L, PA  levothyroxine (SYNTHROID, LEVOTHROID) 175 MCG tablet Take 50 mcg by mouth daily.    [provider]    Family History History reviewed. No pertinent family  history.  Social History Social History   Tobacco Use   Smoking status: Every Day    Packs/day: 1.00    Years: 15.00    Additional pack years: 0.00    Total pack years: 15.00    Types: Cigarettes   Smokeless tobacco: Never  Vaping Use   Vaping Use: Never used  Substance Use Topics   Alcohol use: Yes    Comment: occ   Drug use: No     Allergies   Sulfa antibiotics and Penicillins   Review of Systems Review of Systems  Genitourinary:  Positive for vaginal discharge.  As per HPI   Physical Exam Triage Vital Signs ED Triage Vitals  Enc Vitals Group     BP 10/21/22 1437 115/81     Pulse Rate 10/21/22 1437 81     Resp 10/21/22 1437 17     Temp 10/21/22 1437 98.1 F (36.7 C)     Temp Source 10/21/22 1437 Oral     SpO2 10/21/22 1437 98 %     Weight --      Height --      Head Circumference --      Peak Flow --      Pain Score 10/21/22 1435 0     Pain Loc --  Pain Edu? --      Excl. in GC? --    No data found.  Updated Vital Signs BP 115/81 (BP Location: Left Arm)   Pulse 81   Temp 98.1 F (36.7 C) (Oral)   Resp 17   SpO2 98%   Visual Acuity Right Eye Distance:   Left Eye Distance:   Bilateral Distance:    Right Eye Near:   Left Eye Near:    Bilateral Near:     Physical Exam Vitals and nursing note reviewed.  Constitutional:      General: She is not in acute distress.    Appearance: Normal appearance. She is well-developed. She is not ill-appearing, toxic-appearing or diaphoretic.  HENT:     Head: Normocephalic.  Cardiovascular:     Rate and Rhythm: Normal rate.  Pulmonary:     Effort: Pulmonary effort is normal. No respiratory distress.  Abdominal:     General: Abdomen is flat. Bowel sounds are normal. There is no distension. There are no signs of injury.     Palpations: Abdomen is soft. There is no shifting dullness, fluid wave, hepatomegaly, splenomegaly, mass or pulsatile mass.     Tenderness: There is no abdominal tenderness. There  is no right CVA tenderness, left CVA tenderness, guarding or rebound. Negative signs include Murphy's sign, Rovsing's sign, McBurney's sign, psoas sign and obturator sign.     Hernia: No hernia is present.  Genitourinary:    General: Normal vulva.     Pubic Area: No rash or pubic lice.      Labia:        Right: No rash, tenderness, lesion or injury.        Left: No rash, tenderness, lesion or injury.      Urethra: No prolapse, urethral pain, urethral swelling or urethral lesion.     Vagina: No signs of injury and foreign body. Vaginal discharge (white, thick clumpy) present. No erythema, tenderness, bleeding, lesions or prolapsed vaginal walls.     Cervix: No cervical motion tenderness, discharge, friability, lesion, erythema, cervical bleeding or eversion.     Uterus: Normal. Not deviated, not enlarged, not fixed, not tender and no uterine prolapse.      Adnexa: Right adnexa normal and left adnexa normal.       Right: No mass, tenderness or fullness.         Left: No mass, tenderness or fullness.       Rectum: Normal.  Lymphadenopathy:     Lower Body: No right inguinal adenopathy. No left inguinal adenopathy.  Neurological:     Mental Status: She is alert.      UC Treatments / Results  Labs (all labs ordered are listed, but only abnormal results are displayed) Labs Reviewed  POCT URINALYSIS DIP (MANUAL ENTRY) - Abnormal; Notable for the following components:      Result Value   Blood, UA trace-intact (*)    All other components within normal limits  POCT URINE PREGNANCY  CERVICOVAGINAL ANCILLARY ONLY    EKG   Radiology No results found.  Procedures Procedures (including critical care time)  Medications Ordered in UC Medications - No data to display  Initial Impression / Assessment and Plan / UC Course  I have reviewed the triage vital signs and the nursing notes.  Pertinent labs & imaging results that were available during my care of the patient were reviewed by me  and considered in my medical decision making (see chart for details).  Vaginal yeast infection - diflucan x 1 now, repeat in 72 hours if needed. Aptima swab collected. If recurrent yeast infections persist, pt to f/u with PCP for A1C testing.  Final Clinical Impressions(s) / UC Diagnoses   Final diagnoses:  Vaginal yeast infection     Discharge Instructions      Your symptoms are consistent with a vaginal yeast infection. Please take the Diflucan 1 tablet by mouth today. You may repeat a second dose in 72 hours if needed. We will call with results of your Aptima swab if any additional tests are positive.  Please avoid all forms of intercourse until test results have been received. If you continue to have recurrent yeast infections, please follow-up with your primary care physician to be tested for diabetes.     ED Prescriptions     Medication Sig Dispense Auth. Provider   fluconazole (DIFLUCAN) 150 MG tablet One tab PO x 1 dose now. Repeat in 72 hours as needed 2 tablet Zolton Dowson L, PA      PDMP not reviewed this encounter.   Maretta Bees, [place] 10/22/22 0101

## 2022-10-21 NOTE — Discharge Instructions (Addendum)
Your symptoms are consistent with a vaginal yeast infection. Please take the Diflucan 1 tablet by mouth today. You may repeat a second dose in 72 hours if needed. We will call with results of your Aptima swab if any additional tests are positive.  Please avoid all forms of intercourse until test results have been received. If you continue to have recurrent yeast infections, please follow-up with your primary care physician to be tested for diabetes.

## 2022-10-22 LAB — CERVICOVAGINAL ANCILLARY ONLY
Bacterial Vaginitis (gardnerella): NEGATIVE
Candida Glabrata: NEGATIVE
Candida Vaginitis: NEGATIVE
Chlamydia: NEGATIVE
Comment: NEGATIVE
Comment: NEGATIVE
Comment: NEGATIVE
Comment: NEGATIVE
Comment: NEGATIVE
Comment: NORMAL
Neisseria Gonorrhea: NEGATIVE
Trichomonas: NEGATIVE

## 2022-10-22 NOTE — Progress Notes (Signed)
Lab results within normal limits, my chart message sent to pt.

## 2023-01-22 ENCOUNTER — Encounter (HOSPITAL_COMMUNITY): Payer: Self-pay | Admitting: *Deleted

## 2023-01-22 ENCOUNTER — Ambulatory Visit (HOSPITAL_COMMUNITY)
Admission: EM | Admit: 2023-01-22 | Discharge: 2023-01-22 | Disposition: A | Discharge status: Home / Self Care | Payer: Commercial Managed Care - PPO | Source: Home / Self Care

## 2023-01-22 ENCOUNTER — Ambulatory Visit (HOSPITAL_COMMUNITY): Payer: Commercial Managed Care - PPO

## 2023-01-22 DIAGNOSIS — S92514A Nondisplaced fracture of proximal phalanx of right lesser toe(s), initial encounter for closed fracture: Secondary | ICD-10-CM

## 2023-01-22 MED ORDER — IBUPROFEN 800 MG PO TABS: 800.0000 mg | ORAL_TABLET | Freq: Three times a day (TID) | ORAL | 0 refills | Status: AC

## 2023-01-22 NOTE — Discharge Instructions (Signed)
You have a broken time.  Use buddy taping in the postop shoe for comfort and support.  Take ibuprofen for pain relief.  Do not take additional NSAIDs with this medication including aspirin, ibuprofen/Advil, naproxen/Aleve.  You can use tramadol for additional pain relief as previously prescribed.  I would like you to follow-up with a podiatrist (foot specialist).  Call them to schedule an appointment.  If you have any increasing pain, swelling, cold sensation, numbness or tingling, discoloration you should be seen immediately.

## 2023-01-22 NOTE — ED Triage Notes (Signed)
Pt states she hit her right pinky toe on the wall last night and now there is pain. She is taking tramadol for another issue.

## 2023-01-22 NOTE — ED Provider Notes (Signed)
[facility]    CSN: [id number] Arrival date & time: 01/22/23  1519      History   Chief Complaint Chief Complaint  Patient presents with   Toe Injury    HPI Crystal Leon is a 47 y.o. female.   Patient presents today with a 1 day history of right fifth toe pain.  Reports that she was running around the house when she hit her toe on the wall.  She has had ongoing pain since that time.  This is minimal with breast but increases significantly with attempted ambulation, described as sharp, no alleviating factors notified.  Pain is rated 7 on a 0-10 pain scale.  She denies previous injury or surgery involving her foot.  She is able to ambulate despite symptoms but it is uncomfortable.  She has been taking tramadol that was prescribed for a different indication which provides temporary relief of symptoms.  She has not been taking additional over-the-counter medications.  She does work as a Midwife and it has been more difficult to perform her work duties as a result of pain.    Past Medical History:  Diagnosis Date   Anemia    no current meds.   Anxiety    Arthritis    knee, hands   Bipolar 2 disorder (HCC)    no current meds.   Fibromyalgia    GERD (gastroesophageal reflux disease)    Hypothyroidism    Impingement syndrome, shoulder, left 03/2012   Sleep apnea    states daytime fatigue, occ. 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      Home Medications    Prior to Admission medications   Medication Sig Start Date End Date Taking? Authorizing Provider  ibuprofen (ADVIL) 800 MG tablet  Take 1 tablet (800 mg total) by mouth 3 (three) times daily. 01/22/23  Yes Jonovan Boedecker K, PA-C  levothyroxine (SYNTHROID, LEVOTHROID) 175 MCG tablet Take 50 mcg by mouth daily.   Yes [provider]  traMADol (ULTRAM) 50 MG tablet Take 50 mg by mouth every 6 (six) hours as needed. 01/21/23  Yes [provider]    Family History History reviewed. No pertinent family history.  Social History Social History   Tobacco Use   Smoking status: Every Day    Current packs/day: 1.00    Average packs/day: 1 pack/day for 15.0 years (15.0 ttl pk-yrs)    Types: Cigarettes   Smokeless tobacco: Never  Vaping Use   Vaping status: Never Used  Substance Use Topics   Alcohol use: Yes    Comment: occ   Drug use: No     Allergies   Sulfa antibiotics and Penicillins   Review of Systems Review of Systems  Constitutional:  Positive for activity change. Negative for appetite change, fatigue and fever.  Musculoskeletal:  Positive for arthralgias and gait problem. Negative for myalgias.  Skin:  Negative for color change and wound.  Neurological:  Negative for weakness and numbness.     Physical Exam Triage Vital Signs ED Triage  Vitals  Encounter Vitals Group     BP 01/22/23 1610 (!) 136/92     Systolic BP Percentile --      Diastolic BP Percentile --      Pulse Rate 01/22/23 1610 81     Resp 01/22/23 1610 18     Temp 01/22/23 1610 99.3 F (37.4 C)     Temp Source 01/22/23 1610 Oral     SpO2 01/22/23 1610 98 %     Weight --      Height --      Head Circumference --      Peak Flow --      Pain Score 01/22/23 1609 7     Pain Loc --      Pain Education --      Exclude from Growth Chart --    No data found.  Updated Vital Signs BP (!) 136/92 (BP Location: Right Arm)   Pulse 81   Temp 99.3 F (37.4 C) (Oral)   Resp 18   SpO2 98%   Visual Acuity Right Eye Distance:   Left Eye Distance:   Bilateral Distance:    Right Eye Near:   Left Eye Near:    Bilateral  Near:     Physical Exam Vitals reviewed.  Constitutional:      General: She is awake. She is not in acute distress.    Appearance: Normal appearance. She is well-developed. She is not ill-appearing.     Comments: Very pleasant female appears stated age in no acute distress sitting comfortably in exam room  HENT:     Head: Normocephalic and atraumatic.  Cardiovascular:     Rate and Rhythm: Normal rate and regular rhythm.     Heart sounds: Normal heart sounds, S1 normal and S2 normal. No murmur heard. Pulmonary:     Effort: Pulmonary effort is normal.     Breath sounds: Normal breath sounds. No wheezing, rhonchi or rales.     Comments: Clear to auscultation bilaterally Musculoskeletal:     Right foot: Normal range of motion and normal capillary refill. Tenderness present. No swelling or bony tenderness.     Comments: Right foot: Tenderness palpation over right fifth MTP joint.  No deformity noted.  Foot neurovascularly intact.  Normal gait.  Psychiatric:        Behavior: Behavior is cooperative.      UC Treatments / Results  Labs (all labs ordered are listed, but only abnormal results are displayed) Labs Reviewed - No data to display  EKG   Radiology DG Toe 5th Right  Result Date: 01/22/2023 CLINICAL DATA:  Right pinky pain status post injury. EXAM: RIGHT FIFTH TOE COMPARISON:  None Available. FINDINGS: Oblique nondisplaced fracture through the base of the fifth proximal phalanx. No other fracture or dislocation. Soft tissues are unremarkable. IMPRESSION: 1. Acute oblique nondisplaced fracture through the base of the fifth proximal phalanx. Electronically Signed   By: Elige Ko M.D.   On: 01/22/2023 17:13    Procedures Procedures (including critical care time)  Medications Ordered in UC Medications - No data to display  Initial Impression / Assessment and Plan / UC Course  I have reviewed the triage vital signs and the nursing notes.  Pertinent labs & imaging results  that were available during my care of the patient were reviewed by me and considered in my medical decision making (see chart for details).     Patient is well-appearing, afebrile, nontoxic, nontachycardic.  X-ray was obtained given  bony tenderness with mechanism of injury that showed acute oblique fracture of the proximal fifth phalanx without displacement.  Patient was placed in a postop shoe after buddy tape applied.  She was encouraged to continue using tramadol for pain relief that was previously prescribed and will add ibuprofen.  Discussed that she is not to take NSAIDs with ibuprofen due to risk of GI bleeding.  Encouraged her to follow-up with podiatry for further evaluation and management was given contact information for local provider with instruction to call to schedule an appointment.  Recommended RICE protocol.  Discussed that if she has any worsening or changing symptoms she needs to be seen immediately including increasing pain, swelling, discoloration, cold sensation, numbness or paresthesias.  Strict return precautions given.  Work excuse note provided.  Final Clinical Impressions(s) / UC Diagnoses   Final diagnoses:  Closed nondisplaced fracture of proximal phalanx of lesser toe of right foot, initial encounter     Discharge Instructions      You have a broken time.  Use buddy taping in the postop shoe for comfort and support.  Take ibuprofen for pain relief.  Do not take additional NSAIDs with this medication including aspirin, ibuprofen/Advil, naproxen/Aleve.  You can use tramadol for additional pain relief as previously prescribed.  I would like you to follow-up with a podiatrist (foot specialist).  Call them to schedule an appointment.  If you have any increasing pain, swelling, cold sensation, numbness or tingling, discoloration you should be seen immediately.     ED Prescriptions     Medication Sig Dispense Auth. Provider   ibuprofen (ADVIL) 800 MG tablet Take 1  tablet (800 mg total) by mouth 3 (three) times daily. 21 tablet Yizel Canby K, PA-C      I have reviewed the PDMP during this encounter.   Jeani Hawking, PA-C 01/22/23 1727

## 2023-01-28 ENCOUNTER — Other Ambulatory Visit: Payer: Self-pay

## 2023-01-28 ENCOUNTER — Ambulatory Visit (INDEPENDENT_AMBULATORY_CARE_PROVIDER_SITE_OTHER): Payer: Commercial Managed Care - PPO

## 2023-01-28 ENCOUNTER — Encounter: Payer: Self-pay | Admitting: Podiatry

## 2023-01-28 ENCOUNTER — Ambulatory Visit (INDEPENDENT_AMBULATORY_CARE_PROVIDER_SITE_OTHER): Payer: Commercial Managed Care - PPO | Admitting: Podiatry

## 2023-01-28 DIAGNOSIS — M79671 Pain in right foot: Secondary | ICD-10-CM

## 2023-01-28 DIAGNOSIS — S92501A Displaced unspecified fracture of right lesser toe(s), initial encounter for closed fracture: Secondary | ICD-10-CM | POA: Diagnosis not present

## 2023-01-28 NOTE — Progress Notes (Signed)
  Subjective:  Patient ID: Crystal Leon, female    DOB: 09-01-1975,   MRN: [id number]  Chief Complaint  Patient presents with   Toe Pain    Pt present today for a toe pain. Pt  stated that she hit her toe on the wall at home when she was coming out the bathroom.    47 y.o. female presents for concern of broken right pinky toe. Relates several days ago she was running around the house and hit her foot on a wall. She was seen in urgent care after this and X-rays showed a non-displaced fifth digit fracture. She was given ibuprofen and advised to follow-up. She has been taking tramadol and wearing surgical shoe. Relates swelling has improved.   . Denies any other pedal complaints. Denies n/v/f/c.   Past Medical History:  Diagnosis Date   Anemia    no current meds.   Anxiety    Arthritis    knee, hands   Bipolar 2 disorder (HCC)    no current meds.   Fibromyalgia    GERD (gastroesophageal reflux disease)    Hypothyroidism    Impingement syndrome, shoulder, left 03/2012   Sleep apnea    states daytime fatigue, occ. wakes up gasping/choking; has not had a sleep study    Objective:  Physical Exam: Vascular: DP/PT pulses 2/4 bilateral. CFT <3 seconds. Normal hair growth on digits.  Skin. No lacerations or abrasions bilateral feet.  Musculoskeletal: MMT 5/5 bilateral lower extremities in DF, PF, Inversion and Eversion. Deceased ROM in DF of ankle joint. Tender to proximal phalanx of right fifth digit area. Mild edema noted.  Neurological: Sensation intact to light touch.   Assessment:   1. Closed fracture of phalanx of right fifth toe, initial encounter      Plan:  Patient was evaluated and treated and all questions answered. -Xrays reviewed. Acute non displaced fracture at base of proximal phalanx of right fifth digit.  -Discussed treatement options for toe fracture; risks, alternatives, and benefits explained. -Discussed taping of toe.  -Continue surgical shoe but may wear  supportive tennis shoe to work at this point.  -Recommend protection, rest, ice, elevation daily until symptoms improve -Anti-inflammatories as needed -Patient to return to office in 3 weeks for serial x-rays to assess healing  or sooner if condition worsens.   Louann Sjogren, DPM

## 2023-02-18 ENCOUNTER — Ambulatory Visit (INDEPENDENT_AMBULATORY_CARE_PROVIDER_SITE_OTHER): Payer: Commercial Managed Care - PPO | Admitting: Podiatry

## 2023-02-18 DIAGNOSIS — Z91199 Patient's noncompliance with other medical treatment and regimen due to unspecified reason: Secondary | ICD-10-CM

## 2023-02-18 NOTE — Progress Notes (Signed)
No show

## 2023-07-21 ENCOUNTER — Encounter (HOSPITAL_COMMUNITY): Payer: Self-pay | Admitting: Emergency Medicine

## 2023-07-21 ENCOUNTER — Ambulatory Visit (HOSPITAL_COMMUNITY)
Admission: EM | Admit: 2023-07-21 | Discharge: 2023-07-21 | Disposition: A | Discharge status: Home / Self Care | Payer: Commercial Managed Care - PPO | Attending: Internal Medicine | Admitting: Internal Medicine

## 2023-07-21 ENCOUNTER — Other Ambulatory Visit: Payer: Self-pay

## 2023-07-21 DIAGNOSIS — K0889 Other specified disorders of teeth and supporting structures: Secondary | ICD-10-CM | POA: Diagnosis not present

## 2023-07-21 DIAGNOSIS — K047 Periapical abscess without sinus: Secondary | ICD-10-CM

## 2023-07-21 MED ORDER — CLINDAMYCIN HCL 300 MG PO CAPS: 300.0000 mg | ORAL_CAPSULE | Freq: Three times a day (TID) | ORAL | 0 refills | Status: DC

## 2023-07-21 MED ORDER — TRAMADOL HCL 50 MG PO TABS: 50.0000 mg | ORAL_TABLET | Freq: Four times a day (QID) | ORAL | 0 refills | Status: AC | PRN

## 2023-07-21 NOTE — ED Provider Notes (Signed)
[facility]    CSN: [id number] Arrival date & time: 07/21/23  1623      History   Chief Complaint Chief Complaint  Patient presents with   Dental Pain    HPI Crystal Leon is a 48 y.o. female who presents with R lower molar pain x 3 days where she has a large cavity. Has apt with dentist tomorrow., but can't handle the pain Has been taking Advil 800 mg every 2 hours for pain with minimal relief. Has not had a fever or swollen glands.     Past Medical History:  Diagnosis Date   Anemia    no current meds.   Anxiety    Arthritis    knee, hands   Bipolar 2 disorder (HCC)    no current meds.   Fibromyalgia    GERD (gastroesophageal reflux disease)    Hypothyroidism    Impingement syndrome, shoulder, left 03/2012   Sleep apnea    states daytime fatigue, occ. 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      Home Medications    Prior to Admission medications   Medication Sig Start Date End Date Taking? Authorizing Provider  clindamycin (CLEOCIN) 300 MG capsule Take 1 capsule (300 mg total) by mouth 3 (three) times daily. 07/21/23  Yes Rodriguez-Southworth, Nettie Elm, PA-C  ibuprofen (ADVIL) 800 MG tablet Take 1 tablet (800 mg total) by mouth 3 (three) times daily. 01/22/23   Raspet, Noberto Retort, PA-C  levothyroxine (SYNTHROID, LEVOTHROID) 175 MCG tablet Take 50 mcg by mouth daily.    [provider]  traMADol (ULTRAM) 50 MG tablet Take 1 tablet (50 mg total) by mouth every 6 (six) hours as needed. 07/21/23   Rodriguez-Southworth, Nettie Elm, PA-C    Family History History reviewed. No  pertinent family history.  Social History Social History   Tobacco Use   Smoking status: Every Day    Current packs/day: 1.00    Average packs/day: 1 pack/day for 15.0 years (15.0 ttl pk-yrs)    Types: Cigarettes   Smokeless tobacco: Never  Vaping Use   Vaping status: Never Used  Substance Use Topics   Alcohol use: Yes    Comment: occ   Drug use: No     Allergies   Sulfa antibiotics and Penicillins   Review of Systems Review of Systems  As noted in HPI Physical Exam Triage Vital Signs ED Triage Vitals [07/21/23 1819]  Encounter Vitals Group     BP      Systolic BP Percentile      Diastolic BP Percentile      Pulse      Resp      Temp      Temp src      SpO2      Weight      Height      Head Circumference      Peak Flow      Pain Score 10     Pain Loc      Pain Education  Exclude from Growth Chart    No data found.  Updated Vital Signs BP 136/89 (BP Location: Left Arm)   Pulse 74   Temp 97.9 F (36.6 C) (Oral)   Resp 18   LMP 07/11/2023   SpO2 99%   Visual Acuity Right Eye Distance:   Left Eye Distance:   Bilateral Distance:    Right Eye Near:   Left Eye Near:    Bilateral Near:     Physical Exam Vitals and nursing note reviewed.  Constitutional:      Comments: Is in moderate pain  HENT:     Right Ear: External ear normal.     Left Ear: External ear normal.     Mouth/Throat:      Comments: Has several carious molars, but the R lower posterior one is the largest. The gum is a little red, but no abscess noted.  Eyes:     General: No scleral icterus.    Conjunctiva/sclera: Conjunctivae normal.  Pulmonary:     Effort: Pulmonary effort is normal.  Musculoskeletal:        General: Normal range of motion.     Cervical back: Neck supple.  Lymphadenopathy:     Cervical: No cervical adenopathy.  Skin:    General: Skin is warm and dry.  Neurological:     Mental Status: She is alert and oriented to person, place, and time.     Gait:  Gait normal.  Psychiatric:        Mood and Affect: Mood normal.        Behavior: Behavior normal.        Thought Content: Thought content normal.        Judgment: Judgment normal.      UC Treatments / Results  Labs (all labs ordered are listed, but only abnormal results are displayed) Labs Reviewed - No data to display  EKG   Radiology No results found.  Procedures Procedures (including critical care time)  Medications Ordered in UC Medications - No data to display  Initial Impression / Assessment and Plan / UC Course  I have reviewed the triage vital signs and the nursing notes.  She was placed on Clindamycin and Tramadol as noted. She is to FU with the dentist tomorrow as scheduled.  Final Clinical Impressions(s) / UC Diagnoses   Final diagnoses:  Pain, dental  Dental infection   Discharge Instructions   None    ED Prescriptions     Medication Sig Dispense Auth. Provider   traMADol (ULTRAM) 50 MG tablet Take 1 tablet (50 mg total) by mouth every 6 (six) hours as needed. 8 tablet Rodriguez-Southworth, Jahaira Earnhart, PA-C   clindamycin (CLEOCIN) 300 MG capsule Take 1 capsule (300 mg total) by mouth 3 (three) times daily. 21 capsule Rodriguez-Southworth, Nettie Elm, [place]      I have reviewed the PDMP during this encounter.   Garey Ham, [place] 07/21/23 1837

## 2023-07-21 NOTE — ED Triage Notes (Signed)
Dental pain started 3 days ago.  Pain is on bottom , right in the back of mouth.  Patient reports wisdom teeth have been pulled.    Has an appt for tomorrow morning to see dentist.  Patient has been taking advil-last dose was 7:00 am

## 2023-07-21 NOTE — ED Notes (Signed)
Reviewed provider note for work

## 2024-04-30 ENCOUNTER — Ambulatory Visit (HOSPITAL_COMMUNITY): Admission: EM | Admit: 2024-04-30 | Discharge: 2024-04-30 | Disposition: A | Discharge status: Home / Self Care

## 2024-04-30 ENCOUNTER — Encounter (HOSPITAL_COMMUNITY): Payer: Self-pay | Admitting: *Deleted

## 2024-04-30 DIAGNOSIS — Z113 Encounter for screening for infections with a predominantly sexual mode of transmission: Secondary | ICD-10-CM | POA: Insufficient documentation

## 2024-04-30 DIAGNOSIS — N3001 Acute cystitis with hematuria: Secondary | ICD-10-CM | POA: Insufficient documentation

## 2024-04-30 DIAGNOSIS — B3731 Acute candidiasis of vulva and vagina: Secondary | ICD-10-CM | POA: Insufficient documentation

## 2024-04-30 LAB — HIV ANTIBODY (ROUTINE TESTING W REFLEX): HIV Screen 4th Generation wRfx: NONREACTIVE

## 2024-04-30 LAB — POCT URINE DIPSTICK
Bilirubin, UA: NEGATIVE
Glucose, UA: NEGATIVE mg/dL
Ketones, POC UA: NEGATIVE mg/dL
Nitrite, UA: POSITIVE — AB
Protein Ur, POC: NEGATIVE mg/dL
Spec Grav, UA: 1.02 (ref 1.010–1.025)
Urobilinogen, UA: 0.2 U/dL
pH, UA: 6 (ref 5.0–8.0)

## 2024-04-30 MED ORDER — FLUCONAZOLE 150 MG PO TABS: 150.0000 mg | ORAL_TABLET | Freq: Once | ORAL | 0 refills | Status: AC | PRN

## 2024-04-30 MED ORDER — NITROFURANTOIN MONOHYD MACRO 100 MG PO CAPS: 100.0000 mg | ORAL_CAPSULE | Freq: Two times a day (BID) | ORAL | 0 refills | Status: AC

## 2024-04-30 NOTE — Discharge Instructions (Addendum)
 We will call you if anything on urine culture requires a change in therapy (about 1-3 days)  In the meantime I am treating you for a urinary tract infection. Please take the antibiotic Macrobid  as prescribed, with food to avoid upset stomach. Drink lots of fluids! I have also sent the fluconazole  for yeast infection  We will call you if anything on your vaginal swab or blood work returns positive. You can also see these results on MyChart. Please abstain from sexual intercourse until your results return.

## 2024-04-30 NOTE — ED Triage Notes (Addendum)
 Pt states she has some burning when she urinates X 1 month. She would like STI testing (cyto and blood work), she states no discharge only yeast when wiping

## 2024-04-30 NOTE — ED Provider Notes (Signed)
 [facility]    CSN: [id number] Arrival date & time: 04/30/24  1156      History   Chief Complaint Chief Complaint  Patient presents with   SEXUALLY TRANSMITTED DISEASE   Dysuria    HPI Crystal Leon is a 48 y.o. female.  Dysuria for 4-5 days. No hematuria or urgency/frequency  Some vaginal discharge and itching x 1 month, feels like yeast Would like STD testing today with blood work  Denies abd or flank pain, nausea/vomiting, fever  Postmenopausal   Past Medical History:  Diagnosis Date   Anemia    no current meds.   Anxiety    Arthritis    knee, hands   Bipolar 2 disorder (HCC)    no current meds.   Fibromyalgia    GERD (gastroesophageal reflux disease)    Hypothyroidism    Impingement syndrome, shoulder, left 03/2012   Sleep apnea    states daytime fatigue, occ. 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      Home Medications    Prior to Admission medications   Medication Sig Start Date End Date Taking? Authorizing Provider  estradiol (VIVELLE-DOT) 0.0375 MG/24HR 1 patch 2 (two) times a week. 12/26/23  Yes [provider]  fluconazole  (DIFLUCAN ) 150 MG tablet Take 1 tablet (150 mg total) by mouth once as needed for up to 2 doses (take one pill on day 1, and the second pill 3 days later). 04/30/24  Yes Arthur Aydelotte, Asberry, PA-C  levothyroxine (SYNTHROID, LEVOTHROID) 175 MCG tablet Take 50 mcg by mouth daily.   Yes [provider]  nitrofurantoin , macrocrystal-monohydrate, (MACROBID ) 100 MG capsule Take 1 capsule (100 mg total) by mouth 2 (two) times daily for 7  days. 04/30/24 05/07/24 Yes Satya Buttram, Asberry, PA-C  progesterone (PROMETRIUM) 100 MG capsule Take 100-200 mg by mouth at bedtime. 02/16/24  Yes [provider]  ibuprofen  (ADVIL ) 800 MG tablet Take 1 tablet (800 mg total) by mouth 3 (three) times daily. 01/22/23   Raspet, Erin K, PA-C  traMADol  (ULTRAM ) 50 MG tablet Take 1 tablet (50 mg total) by mouth every 6 (six) hours as needed. 07/21/23   Rodriguez-Southworth, Kyra, PA-C    Family History History reviewed. No pertinent family history.  Social History Social History   Tobacco Use   Smoking status: Every Day    Current packs/day: 1.00    Average packs/day: 1 pack/day for 15.0 years (15.0 ttl pk-yrs)    Types: Cigarettes   Smokeless tobacco: Never  Vaping Use   Vaping status: Never Used  Substance Use Topics   Alcohol use: Yes    Comment: occ   Drug use: No     Allergies   Pregabalin, Sulfa antibiotics, and Penicillins   Review of Systems Review of Systems  Genitourinary:  Positive for dysuria.    As per HPI  Physical Exam Triage Vital Signs ED Triage Vitals  Encounter Vitals Group     BP 04/30/24 1328 132/86     Girls Systolic BP Percentile --  Girls Diastolic BP Percentile --      Boys Systolic BP Percentile --      Boys Diastolic BP Percentile --      Pulse Rate 04/30/24 1328 83     Resp 04/30/24 1328 16     Temp 04/30/24 1328 98.3 F (36.8 C)     Temp Source 04/30/24 1328 Oral     SpO2 04/30/24 1328 98 %     Weight --      Height --      Head Circumference --      Peak Flow --      Pain Score 04/30/24 1326 0     Pain Loc --      Pain Education --      Exclude from Growth Chart --    No data found.  Updated Vital Signs BP 132/86 (BP Location: Right Arm)   Pulse 83   Temp 98.3 F (36.8 C) (Oral)   Resp 16   LMP  (LMP Unknown) Comment: end october unsure of date  SpO2 98%   Visual Acuity Right Eye Distance:   Left Eye Distance:   Bilateral Distance:    Right Eye Near:   Left  Eye Near:    Bilateral Near:     Physical Exam Vitals and nursing note reviewed.  Constitutional:      General: She is not in acute distress. HENT:     Mouth/Throat:     Mouth: Mucous membranes are moist.     Pharynx: Oropharynx is clear.  Eyes:     Conjunctiva/sclera: Conjunctivae normal.     Pupils: Pupils are equal, round, and reactive to light.  Cardiovascular:     Rate and Rhythm: Normal rate and regular rhythm.     Heart sounds: Normal heart sounds.  Pulmonary:     Effort: Pulmonary effort is normal.     Breath sounds: Normal breath sounds.  Abdominal:     General: Bowel sounds are normal.     Palpations: Abdomen is soft.     Tenderness: There is no abdominal tenderness. There is no right CVA tenderness, left CVA tenderness or guarding.  Neurological:     Mental Status: She is alert and oriented to person, place, and time.      UC Treatments / Results  Labs (all labs ordered are listed, but only abnormal results are displayed) Labs Reviewed  POCT URINE DIPSTICK - Abnormal; Notable for the following components:      Result Value   Blood, UA trace-intact (*)    Nitrite, UA Positive (*)    Leukocytes, UA Trace (*)    All other components within normal limits  URINE CULTURE  HIV ANTIBODY (ROUTINE TESTING W REFLEX)  SYPHILIS: RPR W/REFLEX TO RPR TITER AND TREPONEMAL ANTIBODIES, TRADITIONAL SCREENING AND DIAGNOSIS ALGORITHM  CERVICOVAGINAL ANCILLARY ONLY    EKG   Radiology No results found.  Procedures Procedures (including critical care time)  Medications Ordered in UC Medications - No data to display  Initial Impression / Assessment and Plan / UC Course  I have reviewed the triage vital signs and the nursing notes.  Pertinent labs & imaging results that were available during my care of the patient were reviewed by me and considered in my medical decision making (see chart for details).  UA trace leuks, +nitrates, trace RBC Culture pending. Treat for  acute cystis with macrobid  BID x 7 days Fluconazole  for yeast Cytology swab pending with HIV/RPR Questions answered, agrees to plan  Final Clinical Impressions(s) / UC Diagnoses   Final diagnoses:  Acute cystitis with hematuria  Yeast vaginitis  Screen for STD (sexually transmitted disease)     Discharge Instructions      We will call you if anything on urine culture requires a change in therapy (about 1-3 days)  In the meantime I am treating you for a urinary tract infection. Please take the antibiotic Macrobid  as prescribed, with food to avoid upset stomach. Drink lots of fluids! I have also sent the fluconazole  for yeast infection  We will call you if anything on your vaginal swab or blood work returns positive. You can also see these results on MyChart. Please abstain from sexual intercourse until your results return.     ED Prescriptions     Medication Sig Dispense Auth. Provider   nitrofurantoin , macrocrystal-monohydrate, (MACROBID ) 100 MG capsule Take 1 capsule (100 mg total) by mouth 2 (two) times daily for 7 days. 14 capsule Domique Clapper, PA-C   fluconazole  (DIFLUCAN ) 150 MG tablet Take 1 tablet (150 mg total) by mouth once as needed for up to 2 doses (take one pill on day 1, and the second pill 3 days later). 2 tablet Edis Huish, Asberry, PA-C      PDMP not reviewed this encounter.   Jeryl Asberry, PA-C 04/30/24 1426

## 2024-05-01 ENCOUNTER — Ambulatory Visit (HOSPITAL_COMMUNITY): Admit: 2024-05-01 | Discharge: 2024-05-01 | Disposition: A | Discharge status: Home / Self Care

## 2024-05-01 ENCOUNTER — Telehealth (HOSPITAL_COMMUNITY): Payer: Self-pay

## 2024-05-01 LAB — SYPHILIS: RPR W/REFLEX TO RPR TITER AND TREPONEMAL ANTIBODIES, TRADITIONAL SCREENING AND DIAGNOSIS ALGORITHM: RPR Ser Ql: NONREACTIVE

## 2024-05-01 NOTE — Telephone Encounter (Signed)
 Notified by A. [employer]ebaker, RT, I got a call from cyto lab. they said with pt's cyto swab was with the wrong paper work. I tried calling them to have them come in for a reswab.  Attempted to reach patient x1. LVM. Will also send MyChart message.

## 2024-05-01 NOTE — ED Notes (Signed)
 Pt came in today to redo a cyto.

## 2024-05-02 ENCOUNTER — Ambulatory Visit: Payer: Self-pay | Admitting: Emergency Medicine

## 2024-05-02 LAB — URINE CULTURE: Culture: >=100000 — AB

## 2024-05-02 LAB — CERVICOVAGINAL ANCILLARY ONLY
Bacterial Vaginitis (gardnerella): NEGATIVE
Candida Glabrata: NEGATIVE
Candida Vaginitis: POSITIVE — AB
Chlamydia: NEGATIVE
Comment: NEGATIVE
Comment: NEGATIVE
Comment: NEGATIVE
Comment: NEGATIVE
Comment: NEGATIVE
Comment: NORMAL
Neisseria Gonorrhea: NEGATIVE
Trichomonas: NEGATIVE
